# Patient Record
Sex: Male | Born: 1960 | Race: White | Hispanic: No | Marital: Married | Smoking: Former smoker
Health system: Southern US, Community
[De-identification: ages and names within clinical notes are randomized; demographics above are authoritative.]

## PROBLEM LIST (undated history)

## (undated) DIAGNOSIS — I10 Essential (primary) hypertension: Secondary | ICD-10-CM

## (undated) DIAGNOSIS — E785 Hyperlipidemia, unspecified: Secondary | ICD-10-CM

## (undated) DIAGNOSIS — K449 Diaphragmatic hernia without obstruction or gangrene: Secondary | ICD-10-CM

## (undated) DIAGNOSIS — I2510 Atherosclerotic heart disease of native coronary artery without angina pectoris: Secondary | ICD-10-CM

## (undated) HISTORY — DX: Essential (primary) hypertension: I10

## (undated) HISTORY — DX: Hyperlipidemia, unspecified: E78.5

## (undated) HISTORY — DX: Atherosclerotic heart disease of native coronary artery without angina pectoris: I25.10

---

## 2008-05-21 ENCOUNTER — Encounter (INDEPENDENT_AMBULATORY_CARE_PROVIDER_SITE_OTHER): Payer: Self-pay | Admitting: Family Medicine

## 2008-07-18 ENCOUNTER — Ambulatory Visit: Payer: Self-pay | Admitting: Family Medicine

## 2008-07-18 DIAGNOSIS — F411 Generalized anxiety disorder: Secondary | ICD-10-CM | POA: Insufficient documentation

## 2008-07-18 DIAGNOSIS — K219 Gastro-esophageal reflux disease without esophagitis: Secondary | ICD-10-CM

## 2008-07-18 DIAGNOSIS — M129 Arthropathy, unspecified: Secondary | ICD-10-CM | POA: Insufficient documentation

## 2008-07-18 DIAGNOSIS — K449 Diaphragmatic hernia without obstruction or gangrene: Secondary | ICD-10-CM | POA: Insufficient documentation

## 2008-07-18 DIAGNOSIS — F17200 Nicotine dependence, unspecified, uncomplicated: Secondary | ICD-10-CM | POA: Insufficient documentation

## 2008-07-18 DIAGNOSIS — I10 Essential (primary) hypertension: Secondary | ICD-10-CM | POA: Insufficient documentation

## 2008-07-18 DIAGNOSIS — M545 Low back pain: Secondary | ICD-10-CM

## 2008-07-18 LAB — CONVERTED CEMR LAB
Bilirubin Urine: NEGATIVE
Glucose, Urine, Semiquant: NEGATIVE
Protein, U semiquant: NEGATIVE
Urobilinogen, UA: 0.2
pH: 5.5

## 2008-07-20 ENCOUNTER — Encounter (INDEPENDENT_AMBULATORY_CARE_PROVIDER_SITE_OTHER): Payer: Self-pay | Admitting: Family Medicine

## 2008-07-20 LAB — CONVERTED CEMR LAB: Bacteria, UA: NONE SEEN

## 2008-07-29 ENCOUNTER — Encounter (INDEPENDENT_AMBULATORY_CARE_PROVIDER_SITE_OTHER): Payer: Self-pay | Admitting: Family Medicine

## 2008-08-16 ENCOUNTER — Ambulatory Visit: Payer: Self-pay | Admitting: Family Medicine

## 2008-08-23 ENCOUNTER — Telehealth (INDEPENDENT_AMBULATORY_CARE_PROVIDER_SITE_OTHER): Payer: Self-pay | Admitting: Family Medicine

## 2008-11-28 ENCOUNTER — Encounter (INDEPENDENT_AMBULATORY_CARE_PROVIDER_SITE_OTHER): Payer: Self-pay | Admitting: Family Medicine

## 2020-08-16 ENCOUNTER — Inpatient Hospital Stay (HOSPITAL_COMMUNITY): Payer: No Typology Code available for payment source

## 2020-08-16 ENCOUNTER — Emergency Department (HOSPITAL_COMMUNITY): Payer: No Typology Code available for payment source

## 2020-08-16 ENCOUNTER — Other Ambulatory Visit: Payer: Self-pay

## 2020-08-16 ENCOUNTER — Inpatient Hospital Stay (HOSPITAL_COMMUNITY)
Admission: EM | Admit: 2020-08-16 | Discharge: 2020-08-21 | DRG: 246 | Disposition: A | Discharge status: Home / Self Care | Payer: No Typology Code available for payment source | Attending: Internal Medicine | Admitting: Internal Medicine

## 2020-08-16 ENCOUNTER — Encounter (HOSPITAL_COMMUNITY)
Admission: EM | Disposition: A | Discharge status: Home / Self Care | Payer: Self-pay | Source: Home / Self Care | Attending: Internal Medicine

## 2020-08-16 ENCOUNTER — Encounter (HOSPITAL_COMMUNITY): Payer: Self-pay | Admitting: Emergency Medicine

## 2020-08-16 DIAGNOSIS — N179 Acute kidney failure, unspecified: Secondary | ICD-10-CM | POA: Diagnosis present

## 2020-08-16 DIAGNOSIS — R6883 Chills (without fever): Secondary | ICD-10-CM | POA: Diagnosis present

## 2020-08-16 DIAGNOSIS — Z20822 Contact with and (suspected) exposure to covid-19: Secondary | ICD-10-CM | POA: Diagnosis present

## 2020-08-16 DIAGNOSIS — K219 Gastro-esophageal reflux disease without esophagitis: Secondary | ICD-10-CM | POA: Diagnosis present

## 2020-08-16 DIAGNOSIS — Z720 Tobacco use: Secondary | ICD-10-CM

## 2020-08-16 DIAGNOSIS — I5043 Acute on chronic combined systolic (congestive) and diastolic (congestive) heart failure: Secondary | ICD-10-CM | POA: Diagnosis not present

## 2020-08-16 DIAGNOSIS — E781 Pure hyperglyceridemia: Secondary | ICD-10-CM | POA: Diagnosis present

## 2020-08-16 DIAGNOSIS — W57XXXA Bitten or stung by nonvenomous insect and other nonvenomous arthropods, initial encounter: Secondary | ICD-10-CM | POA: Diagnosis present

## 2020-08-16 DIAGNOSIS — I2119 ST elevation (STEMI) myocardial infarction involving other coronary artery of inferior wall: Secondary | ICD-10-CM

## 2020-08-16 DIAGNOSIS — I498 Other specified cardiac arrhythmias: Secondary | ICD-10-CM | POA: Diagnosis not present

## 2020-08-16 DIAGNOSIS — I150 Renovascular hypertension: Secondary | ICD-10-CM

## 2020-08-16 DIAGNOSIS — K449 Diaphragmatic hernia without obstruction or gangrene: Secondary | ICD-10-CM | POA: Diagnosis present

## 2020-08-16 DIAGNOSIS — I25110 Atherosclerotic heart disease of native coronary artery with unstable angina pectoris: Secondary | ICD-10-CM | POA: Diagnosis present

## 2020-08-16 DIAGNOSIS — N1831 Chronic kidney disease, stage 3a: Secondary | ICD-10-CM | POA: Diagnosis present

## 2020-08-16 DIAGNOSIS — F17213 Nicotine dependence, cigarettes, with withdrawal: Secondary | ICD-10-CM | POA: Diagnosis present

## 2020-08-16 DIAGNOSIS — Z955 Presence of coronary angioplasty implant and graft: Secondary | ICD-10-CM

## 2020-08-16 DIAGNOSIS — K30 Functional dyspepsia: Secondary | ICD-10-CM | POA: Diagnosis present

## 2020-08-16 DIAGNOSIS — R1013 Epigastric pain: Secondary | ICD-10-CM

## 2020-08-16 DIAGNOSIS — I213 ST elevation (STEMI) myocardial infarction of unspecified site: Secondary | ICD-10-CM | POA: Diagnosis present

## 2020-08-16 DIAGNOSIS — I130 Hypertensive heart and chronic kidney disease with heart failure and stage 1 through stage 4 chronic kidney disease, or unspecified chronic kidney disease: Secondary | ICD-10-CM | POA: Diagnosis present

## 2020-08-16 DIAGNOSIS — Z79899 Other long term (current) drug therapy: Secondary | ICD-10-CM

## 2020-08-16 DIAGNOSIS — I2111 ST elevation (STEMI) myocardial infarction involving right coronary artery: Secondary | ICD-10-CM | POA: Diagnosis not present

## 2020-08-16 DIAGNOSIS — E785 Hyperlipidemia, unspecified: Secondary | ICD-10-CM | POA: Diagnosis present

## 2020-08-16 DIAGNOSIS — I110 Hypertensive heart disease with heart failure: Secondary | ICD-10-CM | POA: Diagnosis present

## 2020-08-16 DIAGNOSIS — Y929 Unspecified place or not applicable: Secondary | ICD-10-CM | POA: Diagnosis not present

## 2020-08-16 DIAGNOSIS — F17200 Nicotine dependence, unspecified, uncomplicated: Secondary | ICD-10-CM | POA: Diagnosis present

## 2020-08-16 DIAGNOSIS — R0600 Dyspnea, unspecified: Secondary | ICD-10-CM

## 2020-08-16 DIAGNOSIS — I2510 Atherosclerotic heart disease of native coronary artery without angina pectoris: Secondary | ICD-10-CM

## 2020-08-16 DIAGNOSIS — N182 Chronic kidney disease, stage 2 (mild): Secondary | ICD-10-CM | POA: Diagnosis present

## 2020-08-16 DIAGNOSIS — I200 Unstable angina: Secondary | ICD-10-CM | POA: Diagnosis present

## 2020-08-16 DIAGNOSIS — E7800 Pure hypercholesterolemia, unspecified: Secondary | ICD-10-CM | POA: Diagnosis not present

## 2020-08-16 DIAGNOSIS — N189 Chronic kidney disease, unspecified: Secondary | ICD-10-CM | POA: Diagnosis not present

## 2020-08-16 DIAGNOSIS — I10 Essential (primary) hypertension: Secondary | ICD-10-CM | POA: Diagnosis present

## 2020-08-16 DIAGNOSIS — I771 Stricture of artery: Secondary | ICD-10-CM | POA: Diagnosis present

## 2020-08-16 HISTORY — PX: CORONARY/GRAFT ACUTE MI REVASCULARIZATION: CATH118305

## 2020-08-16 HISTORY — PX: LEFT HEART CATH AND CORONARY ANGIOGRAPHY: CATH118249

## 2020-08-16 HISTORY — DX: Diaphragmatic hernia without obstruction or gangrene: K44.9

## 2020-08-16 LAB — POCT ACTIVATED CLOTTING TIME: Activated Clotting Time: 243 seconds

## 2020-08-16 LAB — CBC WITH DIFFERENTIAL/PLATELET
Abs Immature Granulocytes: 0.06 10*3/uL (ref 0.00–0.07)
Basophils Absolute: 0.1 10*3/uL (ref 0.0–0.1)
Basophils Relative: 0 %
Eosinophils Absolute: 0.1 10*3/uL (ref 0.0–0.5)
Eosinophils Relative: 1 %
HCT: 50.8 % (ref 39.0–52.0)
Hemoglobin: 16.6 g/dL (ref 13.0–17.0)
Immature Granulocytes: 0 %
Lymphocytes Relative: 14 %
Lymphs Abs: 2.2 10*3/uL (ref 0.7–4.0)
MCH: 28.9 pg (ref 26.0–34.0)
MCHC: 32.7 g/dL (ref 30.0–36.0)
MCV: 88.5 fL (ref 80.0–100.0)
Monocytes Absolute: 0.9 10*3/uL (ref 0.1–1.0)
Monocytes Relative: 6 %
Neutro Abs: 12.1 10*3/uL — ABNORMAL HIGH (ref 1.7–7.7)
Neutrophils Relative %: 79 %
Platelets: 283 10*3/uL (ref 150–400)
RBC: 5.74 MIL/uL (ref 4.22–5.81)
RDW: 14.5 % (ref 11.5–15.5)
WBC: 15.3 10*3/uL — ABNORMAL HIGH (ref 4.0–10.5)
nRBC: 0 % (ref 0.0–0.2)

## 2020-08-16 LAB — BASIC METABOLIC PANEL
Anion gap: 10 (ref 5–15)
BUN: 16 mg/dL (ref 6–20)
CO2: 24 mmol/L (ref 22–32)
Calcium: 8.4 mg/dL — ABNORMAL LOW (ref 8.9–10.3)
Chloride: 103 mmol/L (ref 98–111)
Creatinine, Ser: 1.17 mg/dL (ref 0.61–1.24)
GFR, Estimated: >60 mL/min (ref >60)
Glucose, Bld: 101 mg/dL — ABNORMAL HIGH (ref 70–99)
Potassium: 3.6 mmol/L (ref 3.5–5.1)
Sodium: 137 mmol/L (ref 135–145)

## 2020-08-16 LAB — COMPREHENSIVE METABOLIC PANEL
ALT: 25 U/L (ref 0–44)
AST: 54 U/L — ABNORMAL HIGH (ref 15–41)
Albumin: 4.1 g/dL (ref 3.5–5.0)
Alkaline Phosphatase: 98 U/L (ref 38–126)
Anion gap: 10 (ref 5–15)
BUN: 19 mg/dL (ref 6–20)
CO2: 28 mmol/L (ref 22–32)
Calcium: 8.7 mg/dL — ABNORMAL LOW (ref 8.9–10.3)
Chloride: 99 mmol/L (ref 98–111)
Creatinine, Ser: 1.3 mg/dL — ABNORMAL HIGH (ref 0.61–1.24)
GFR, Estimated: >60 mL/min (ref >60)
Glucose, Bld: 121 mg/dL — ABNORMAL HIGH (ref 70–99)
Potassium: 3.3 mmol/L — ABNORMAL LOW (ref 3.5–5.1)
Sodium: 137 mmol/L (ref 135–145)
Total Bilirubin: 1.2 mg/dL (ref 0.3–1.2)
Total Protein: 7.9 g/dL (ref 6.5–8.1)

## 2020-08-16 LAB — ECHOCARDIOGRAM COMPLETE
Area-P 1/2: 3.42 cm2
S' Lateral: 3.75 cm
Weight: 3600 oz

## 2020-08-16 LAB — PROTIME-INR
INR: 1 (ref 0.8–1.2)
Prothrombin Time: 13.1 seconds (ref 11.4–15.2)

## 2020-08-16 LAB — RESP PANEL BY RT-PCR (FLU A&B, COVID) ARPGX2
Influenza A by PCR: NEGATIVE
Influenza B by PCR: NEGATIVE
SARS Coronavirus 2 by RT PCR: NEGATIVE

## 2020-08-16 LAB — LIPID PANEL
Cholesterol: 223 mg/dL — ABNORMAL HIGH (ref 0–200)
HDL: 34 mg/dL — ABNORMAL LOW (ref >40)
LDL Cholesterol: 153 mg/dL — ABNORMAL HIGH (ref 0–99)
Total CHOL/HDL Ratio: 6.6 RATIO
Triglycerides: 180 mg/dL — ABNORMAL HIGH (ref <150)
VLDL: 36 mg/dL (ref 0–40)

## 2020-08-16 LAB — APTT: aPTT: 30 seconds (ref 24–36)

## 2020-08-16 LAB — TROPONIN I (HIGH SENSITIVITY)
Troponin I (High Sensitivity): 2351 ng/L (ref <18)
Troponin I (High Sensitivity): >24000 ng/L (ref <18)

## 2020-08-16 LAB — MAGNESIUM: Magnesium: 2.1 mg/dL (ref 1.7–2.4)

## 2020-08-16 LAB — GLUCOSE, CAPILLARY: Glucose-Capillary: 107 mg/dL — ABNORMAL HIGH (ref 70–99)

## 2020-08-16 LAB — MRSA PCR SCREENING: MRSA by PCR: NEGATIVE

## 2020-08-16 SURGERY — CORONARY/GRAFT ACUTE MI REVASCULARIZATION
Anesthesia: LOCAL

## 2020-08-16 MED ORDER — METOPROLOL TARTRATE 12.5 MG HALF TABLET
12.5000 mg | ORAL_TABLET | Freq: Two times a day (BID) | ORAL | Status: DC
  Administered 2020-08-16 – 2020-08-17 (×3): 12.5 mg via ORAL
  Filled 2020-08-16 (×3): qty 1

## 2020-08-16 MED ORDER — ATORVASTATIN CALCIUM 80 MG PO TABS
80.0000 mg | ORAL_TABLET | Freq: Every day | ORAL | Status: DC
  Administered 2020-08-16 – 2020-08-21 (×6): 80 mg via ORAL
  Filled 2020-08-16 (×6): qty 1

## 2020-08-16 MED ORDER — FENTANYL CITRATE (PF) 100 MCG/2ML IJ SOLN
INTRAMUSCULAR | Status: DC | PRN
  Administered 2020-08-16 (×2): 25 ug via INTRAVENOUS

## 2020-08-16 MED ORDER — LIDOCAINE HCL (PF) 1 % IJ SOLN
INTRAMUSCULAR | Status: DC | PRN
  Administered 2020-08-16: 2 mL

## 2020-08-16 MED ORDER — SODIUM CHLORIDE 0.9 % IV SOLN
INTRAVENOUS | Status: DC
  Administered 2020-08-16: 1000 mL via INTRAVENOUS

## 2020-08-16 MED ORDER — CHLORHEXIDINE GLUCONATE CLOTH 2 % EX PADS
6.0000 | MEDICATED_PAD | Freq: Every day | CUTANEOUS | Status: DC
  Administered 2020-08-16 – 2020-08-19 (×4): 6 via TOPICAL

## 2020-08-16 MED ORDER — TICAGRELOR 90 MG PO TABS
ORAL_TABLET | ORAL | Status: AC
  Filled 2020-08-16: qty 2

## 2020-08-16 MED ORDER — NITROGLYCERIN IN D5W 200-5 MCG/ML-% IV SOLN
0.0000 ug/min | INTRAVENOUS | Status: DC
  Administered 2020-08-17: 45 ug/min via INTRAVENOUS
  Administered 2020-08-18: 100 ug/min via INTRAVENOUS
  Filled 2020-08-16 (×3): qty 250

## 2020-08-16 MED ORDER — HEPARIN BOLUS VIA INFUSION: 5000.0000 [IU] | Freq: Once | INTRAVENOUS | Status: AC

## 2020-08-16 MED ORDER — POTASSIUM CHLORIDE CRYS ER 20 MEQ PO TBCR
40.0000 meq | EXTENDED_RELEASE_TABLET | ORAL | Status: AC
  Administered 2020-08-16 (×2): 40 meq via ORAL
  Filled 2020-08-16 (×2): qty 2

## 2020-08-16 MED ORDER — MORPHINE SULFATE (PF) 2 MG/ML IV SOLN
2.0000 mg | INTRAVENOUS | Status: DC | PRN
Start: 2020-08-16 — End: 2020-08-16
  Administered 2020-08-16 (×3): 2 mg via INTRAVENOUS
  Filled 2020-08-16 (×3): qty 1

## 2020-08-16 MED ORDER — TIROFIBAN HCL IN NACL 5-0.9 MG/100ML-% IV SOLN
0.1500 ug/kg/min | INTRAVENOUS | Status: AC
  Administered 2020-08-16: 0.15 ug/kg/min via INTRAVENOUS
  Filled 2020-08-16: qty 100

## 2020-08-16 MED ORDER — ACETAMINOPHEN 325 MG PO TABS
650.0000 mg | ORAL_TABLET | ORAL | Status: DC | PRN
  Administered 2020-08-16 – 2020-08-20 (×4): 650 mg via ORAL
  Filled 2020-08-16 (×4): qty 2

## 2020-08-16 MED ORDER — ASPIRIN 81 MG PO CHEW
81.0000 mg | CHEWABLE_TABLET | Freq: Every day | ORAL | Status: DC
  Administered 2020-08-17 – 2020-08-21 (×5): 81 mg via ORAL
  Filled 2020-08-16 (×6): qty 1

## 2020-08-16 MED ORDER — LIDOCAINE HCL (PF) 1 % IJ SOLN
INTRAMUSCULAR | Status: AC
  Filled 2020-08-16: qty 30

## 2020-08-16 MED ORDER — ONDANSETRON HCL 4 MG/2ML IJ SOLN
4.0000 mg | Freq: Four times a day (QID) | INTRAMUSCULAR | Status: DC | PRN
Start: 2020-08-16 — End: 2020-08-21
  Administered 2020-08-16: 4 mg via INTRAVENOUS
  Filled 2020-08-16: qty 2

## 2020-08-16 MED ORDER — LABETALOL HCL 5 MG/ML IV SOLN: 10.0000 mg | INTRAVENOUS | Status: AC | PRN

## 2020-08-16 MED ORDER — HEPARIN (PORCINE) 25000 UT/250ML-% IV SOLN
INTRAVENOUS | Status: AC
  Administered 2020-08-16: 5000 [IU] via INTRAVENOUS
  Filled 2020-08-16: qty 250

## 2020-08-16 MED ORDER — HEPARIN SODIUM (PORCINE) 1000 UNIT/ML IJ SOLN
INTRAMUSCULAR | Status: AC
  Filled 2020-08-16: qty 1

## 2020-08-16 MED ORDER — NITROGLYCERIN 0.4 MG SL SUBL
0.4000 mg | SUBLINGUAL_TABLET | SUBLINGUAL | Status: DC | PRN
  Administered 2020-08-16 (×3): 0.4 mg via SUBLINGUAL
  Filled 2020-08-16: qty 1

## 2020-08-16 MED ORDER — ALPRAZOLAM 0.5 MG PO TABS
0.5000 mg | ORAL_TABLET | Freq: Three times a day (TID) | ORAL | Status: AC | PRN
  Administered 2020-08-16 (×2): 0.5 mg via ORAL
  Filled 2020-08-16 (×2): qty 1

## 2020-08-16 MED ORDER — NITROGLYCERIN IN D5W 200-5 MCG/ML-% IV SOLN
INTRAVENOUS | Status: AC
  Filled 2020-08-16: qty 250

## 2020-08-16 MED ORDER — ENOXAPARIN SODIUM 40 MG/0.4ML IJ SOSY
40.0000 mg | PREFILLED_SYRINGE | INTRAMUSCULAR | Status: DC
  Administered 2020-08-17: 40 mg via SUBCUTANEOUS
  Filled 2020-08-16: qty 0.4

## 2020-08-16 MED ORDER — HEPARIN SODIUM (PORCINE) 1000 UNIT/ML IJ SOLN
INTRAMUSCULAR | Status: DC | PRN
  Administered 2020-08-16: 5000 [IU] via INTRAVENOUS

## 2020-08-16 MED ORDER — DOXYCYCLINE HYCLATE 100 MG PO TABS
100.0000 mg | ORAL_TABLET | Freq: Two times a day (BID) | ORAL | Status: DC
  Administered 2020-08-16 – 2020-08-21 (×11): 100 mg via ORAL
  Filled 2020-08-16 (×11): qty 1

## 2020-08-16 MED ORDER — FUROSEMIDE 10 MG/ML IJ SOLN
INTRAMUSCULAR | Status: AC
  Filled 2020-08-16: qty 4

## 2020-08-16 MED ORDER — ASPIRIN 81 MG PO CHEW
324.0000 mg | CHEWABLE_TABLET | Freq: Once | ORAL | Status: AC
  Administered 2020-08-16: 324 mg via ORAL
  Filled 2020-08-16: qty 4

## 2020-08-16 MED ORDER — MIDAZOLAM HCL 2 MG/2ML IJ SOLN
INTRAMUSCULAR | Status: DC | PRN
  Administered 2020-08-16 (×2): 1 mg via INTRAVENOUS

## 2020-08-16 MED ORDER — NITROGLYCERIN 1 MG/10 ML FOR IR/CATH LAB
INTRA_ARTERIAL | Status: DC | PRN
  Administered 2020-08-16: 200 ug via INTRACORONARY

## 2020-08-16 MED ORDER — TIROFIBAN HCL IN NACL 5-0.9 MG/100ML-% IV SOLN
INTRAVENOUS | Status: AC | PRN
  Administered 2020-08-16: 0.15 ug/kg/min via INTRAVENOUS

## 2020-08-16 MED ORDER — TIROFIBAN HCL IN NACL 5-0.9 MG/100ML-% IV SOLN
INTRAVENOUS | Status: AC
  Filled 2020-08-16: qty 100

## 2020-08-16 MED ORDER — VERAPAMIL HCL 2.5 MG/ML IV SOLN
INTRAVENOUS | Status: AC
  Filled 2020-08-16: qty 2

## 2020-08-16 MED ORDER — FUROSEMIDE 10 MG/ML IJ SOLN
INTRAMUSCULAR | Status: DC | PRN
  Administered 2020-08-16: 40 mg via INTRAVENOUS

## 2020-08-16 MED ORDER — HYDRALAZINE HCL 20 MG/ML IJ SOLN
10.0000 mg | INTRAMUSCULAR | Status: AC | PRN
  Administered 2020-08-16: 10 mg via INTRAVENOUS
  Filled 2020-08-16: qty 1

## 2020-08-16 MED ORDER — HEPARIN (PORCINE) IN NACL 1000-0.9 UT/500ML-% IV SOLN
INTRAVENOUS | Status: AC
  Filled 2020-08-16: qty 1000

## 2020-08-16 MED ORDER — TIROFIBAN (AGGRASTAT) BOLUS VIA INFUSION
INTRAVENOUS | Status: DC | PRN
  Administered 2020-08-16: 2552.5 ug via INTRAVENOUS

## 2020-08-16 MED ORDER — FENTANYL CITRATE (PF) 100 MCG/2ML IJ SOLN
INTRAMUSCULAR | Status: AC
  Filled 2020-08-16: qty 2

## 2020-08-16 MED ORDER — MORPHINE SULFATE (PF) 2 MG/ML IV SOLN
2.0000 mg | INTRAVENOUS | Status: DC | PRN
  Administered 2020-08-16: 2 mg via INTRAVENOUS
  Administered 2020-08-16: 4 mg via INTRAVENOUS
  Administered 2020-08-17: 2 mg via INTRAVENOUS
  Administered 2020-08-17 (×7): 4 mg via INTRAVENOUS
  Administered 2020-08-18 (×2): 2 mg via INTRAVENOUS
  Filled 2020-08-16: qty 1
  Filled 2020-08-16 (×3): qty 2
  Filled 2020-08-16: qty 1
  Filled 2020-08-16: qty 2
  Filled 2020-08-16: qty 1
  Filled 2020-08-16 (×2): qty 2
  Filled 2020-08-16: qty 1
  Filled 2020-08-16 (×2): qty 2

## 2020-08-16 MED ORDER — HEPARIN SODIUM (PORCINE) 5000 UNIT/ML IJ SOLN: 60.0000 [IU]/kg | Freq: Once | INTRAMUSCULAR | Status: DC

## 2020-08-16 MED ORDER — NITROGLYCERIN IN D5W 200-5 MCG/ML-% IV SOLN
INTRAVENOUS | Status: AC | PRN
  Administered 2020-08-16: 10 ug/min via INTRAVENOUS

## 2020-08-16 MED ORDER — SODIUM CHLORIDE 0.9% FLUSH
3.0000 mL | Freq: Two times a day (BID) | INTRAVENOUS | Status: DC
  Administered 2020-08-16 – 2020-08-20 (×8): 3 mL via INTRAVENOUS

## 2020-08-16 MED ORDER — NITROGLYCERIN 1 MG/10 ML FOR IR/CATH LAB
INTRA_ARTERIAL | Status: AC
  Filled 2020-08-16: qty 10

## 2020-08-16 MED ORDER — HEPARIN (PORCINE) IN NACL 1000-0.9 UT/500ML-% IV SOLN
INTRAVENOUS | Status: DC | PRN
  Administered 2020-08-16 (×2): 500 mL

## 2020-08-16 MED ORDER — SODIUM CHLORIDE 0.9% FLUSH: 3.0000 mL | INTRAVENOUS | Status: DC | PRN

## 2020-08-16 MED ORDER — IOHEXOL 350 MG/ML SOLN
INTRAVENOUS | Status: DC | PRN
  Administered 2020-08-16: 100 mL

## 2020-08-16 MED ORDER — TICAGRELOR 90 MG PO TABS
90.0000 mg | ORAL_TABLET | Freq: Two times a day (BID) | ORAL | Status: DC
  Administered 2020-08-16 – 2020-08-20 (×8): 90 mg via ORAL
  Filled 2020-08-16 (×9): qty 1

## 2020-08-16 MED ORDER — VERAPAMIL HCL 2.5 MG/ML IV SOLN
INTRAVENOUS | Status: DC | PRN
  Administered 2020-08-16: 10 mL via INTRA_ARTERIAL

## 2020-08-16 MED ORDER — MIDAZOLAM HCL 2 MG/2ML IJ SOLN
INTRAMUSCULAR | Status: AC
  Filled 2020-08-16: qty 2

## 2020-08-16 MED ORDER — TICAGRELOR 90 MG PO TABS
ORAL_TABLET | ORAL | Status: DC | PRN
  Administered 2020-08-16: 180 mg via ORAL

## 2020-08-16 MED ORDER — SODIUM CHLORIDE 0.9 % IV SOLN: 250.0000 mL | INTRAVENOUS | Status: DC | PRN

## 2020-08-16 SURGICAL SUPPLY — 18 items
BALLN SAPPHIRE 2.5X12 (BALLOONS) ×2
BALLN SAPPHIRE ~~LOC~~ 4.0X12 (BALLOONS) ×2 IMPLANT
BALLOON SAPPHIRE 2.5X12 (BALLOONS) ×1 IMPLANT
CATH INFINITI 5 FR JL3.5 (CATHETERS) ×2 IMPLANT
CATH LAUNCHER 5F JR4 (CATHETERS) ×2 IMPLANT
CATH LAUNCHER 6FR JR4 (CATHETERS) ×2 IMPLANT
DEVICE RAD COMP TR BAND LRG (VASCULAR PRODUCTS) ×2 IMPLANT
ELECT DEFIB PAD ADLT CADENCE (PAD) ×2 IMPLANT
GLIDESHEATH SLEND SS 6F .021 (SHEATH) ×2 IMPLANT
GUIDEWIRE INQWIRE 1.5J.035X260 (WIRE) ×1 IMPLANT
INQWIRE 1.5J .035X260CM (WIRE) ×2
KIT ENCORE 26 ADVANTAGE (KITS) ×2 IMPLANT
KIT HEART LEFT (KITS) ×2 IMPLANT
PACK CARDIAC CATHETERIZATION (CUSTOM PROCEDURE TRAY) ×2 IMPLANT
STENT RESOLUTE ONYX 3.5X18 (Permanent Stent) ×2 IMPLANT
TRANSDUCER W/STOPCOCK (MISCELLANEOUS) ×2 IMPLANT
TUBING CIL FLEX 10 FLL-RA (TUBING) ×2 IMPLANT
WIRE RUNTHROUGH .014X180CM (WIRE) ×2 IMPLANT

## 2020-08-16 NOTE — ED Provider Notes (Signed)
I was shown the patient's ECG around 0714. It appeared to be a STEMI. I talked to the patient who was having chest pain. Discussed with nursing, STEMI activated around 0715. Order set initiated. Full note and care to be taken by Dr. Rhunette Croft.    Aujanae Mccullum, Barbara Cower, MD 08/16/20 [phone]

## 2020-08-16 NOTE — ED Notes (Signed)
Date and time results received: 08/16/20 08090  Test: Trop Critical Value: 2351  Name of Provider Notified: Rhunette Croft, Ankit,MD  Orders Received? Or Actions Taken?: See MAR, pt transported for cath

## 2020-08-16 NOTE — ED Notes (Signed)
Pt placed on 2L Page

## 2020-08-16 NOTE — Progress Notes (Signed)
[facility]  Date: 08/16/20  Time: 3:50 PM  Patient reevaluated due to intermittent chest discomfort.  He states that it comes and goes, lasting only a few seconds.  He feels very uneasy about it.  It seems to correspond to rhythm changes on the monitor.  Tele shows intermittent sinus rhythm and AIVR as well as PVC's and brief NSVT.  He received alprazolam 0.5 mg x 1 earlier with minimal relief.  He was also started on metoprolol and just received morphine 2 mg IV x 1.  He feels like the morphine has helped.  We will continue to monitor.  Hopefully, AIVR will improve over the next few hours.  Potassium has been repleated with repeat BMP and HS-TnI pending.  Will repeat EKG for further chest pain.  Continue to titrate IV NTG.  Yvonne Kendall, MD [facility]

## 2020-08-16 NOTE — ED Provider Notes (Addendum)
[facility] EMERGENCY DEPARTMENT Provider Note   CSN: [id number] Arrival date & time: 08/16/20  0703     History Chief Complaint  Patient presents with  . Code STEMI    John Wilkerson is a 60 y.o. male.  HPI    60 year old male with history of hypertension, GERD, hiatal hernia comes in with chief complaint of heartburn.  For the past 3 days patient has been waking up with severe heartburn type feeling.  Usually the symptoms resolved within a couple of hours.  Today the pain is not letting.  He has associated diaphoresis and feeling clammy.  Patient denies any shortness of breath, nausea.  He denies any exertional chest pain.  Patient smokes about a pack a day. No history of recent cardiac work-up.  He reports that he has history of hernia and GERD, does not take any medications for it.  He has been thinking that this pain is GERD all along, until today when the pain was unrelenting and not responding to antacid.  History reviewed. No pertinent past medical history.  Patient Active Problem List   Diagnosis Date Noted  . ANXIETY 07/18/2008  . TOBACCO USER 07/18/2008  . HYPERTENSION 07/18/2008  . GERD 07/18/2008  . HIATAL HERNIA 07/18/2008  . ARTHRITIS 07/18/2008  . LOW BACK PAIN, CHRONIC 07/18/2008     No family history on file.     Home Medications Prior to Admission medications   Not on File    Allergies    Codeine  Review of Systems   Review of Systems  Constitutional: Positive for activity change.  Respiratory: Negative for shortness of breath.   Cardiovascular: Negative for chest pain.  Gastrointestinal: Positive for abdominal pain.  Hematological: Does not bruise/bleed easily.  All other systems reviewed and are negative.   Physical Exam Updated Vital Signs BP (!) 180/91   Pulse 64   Temp 98.3 F (36.8 C)   Resp 15   Wt 102.1 kg   SpO2 94%   Physical Exam Vitals and nursing note reviewed.  Constitutional:      Appearance: He is  well-developed.  HENT:     Head: Atraumatic.  Cardiovascular:     Rate and Rhythm: Normal rate.  Pulmonary:     Effort: Pulmonary effort is normal.  Musculoskeletal:     Cervical back: Neck supple.  Skin:    General: Skin is warm.  Neurological:     Mental Status: He is alert and oriented to person, place, and time.     ED Results / Procedures / Treatments   Labs (all labs ordered are listed, but only abnormal results are displayed) Labs Reviewed  CBC WITH DIFFERENTIAL/PLATELET - Abnormal; Notable for the following components:      Result Value   WBC 15.3 (*)    Neutro Abs 12.1 (*)    All other components within normal limits  RESP PANEL BY RT-PCR (FLU A&B, COVID) ARPGX2  PROTIME-INR  APTT  HEMOGLOBIN A1C  COMPREHENSIVE METABOLIC PANEL  LIPID PANEL  TROPONIN I (HIGH SENSITIVITY)    EKG EKG Interpretation  Date/Time:  Saturday August 16 2020 07:13:23 EDT Ventricular Rate:  70 PR Interval:  144 QRS Duration: 125 QT Interval:  477 QTC Calculation: 515 R Axis:   222 Text Interpretation: Sinus rhythm Probable left atrial enlargement Nonspecific intraventricular conduction delay Inferior infarct, acute (RCA) Anteroseptal infarct, old Probable RV involvement, suggest recording right precordial leads repeat - unchanged Confirmed by Derwood Kaplan [phone]) on 08/16/2020 7:23:17 AM  EKG Interpretation  Date/Time:  Saturday August 16 2020 07:20:21 EDT Ventricular Rate:  73 PR Interval:  145 QRS Duration: 125 QT Interval:  457 QTC Calculation: 504 R Axis:   220 Text Interpretation: Sinus rhythm Probable left atrial enlargement Nonspecific intraventricular conduction delay Inferior infarct, acute (RCA) Probable RV involvement, suggest recording right precordial leads No significant change since last tracing Confirmed by Derwood Kaplan [phone]) on 08/16/2020 7:58:03 AM        Radiology No results found.  Procedures .Critical Care Performed by: Derwood Kaplan,  MD Authorized by: Derwood Kaplan, MD   Critical care provider statement:    Critical care time (minutes):  40   Critical care was necessary to treat or prevent imminent or life-threatening deterioration of the following conditions:  Cardiac failure   Critical care was time spent personally by me on the following activities:  Discussions with consultants, evaluation of patient's response to treatment, examination of patient, ordering and performing treatments and interventions, ordering and review of laboratory studies, ordering and review of radiographic studies, pulse oximetry, re-evaluation of patient's condition, obtaining history from patient or surrogate and review of old charts     Medications Ordered in ED Medications  0.9 %  sodium chloride infusion (1,000 mLs Intravenous New Bag/Given 08/16/20 0725)  nitroGLYCERIN (NITROSTAT) SL tablet 0.4 mg (0.4 mg Sublingual Given 08/16/20 0736)  aspirin chewable tablet 324 mg (324 mg Oral Given 08/16/20 0724)  heparin bolus via infusion 5,000 Units (5,000 Units Intravenous Bolus from Bag 08/16/20 0729)    ED Course  I have reviewed the triage vital signs and the nursing notes.  Pertinent labs & imaging results that were available during my care of the patient were reviewed by me and considered in my medical decision making (see chart for details).    MDM Rules/Calculators/A&P                          DDx includes: Pancreatitis Hepatobiliary pathology including cholecystitis Gastritis/PUD SBO ACS syndrome Aortic Dissection  60 year old male comes in with chief complaint of epigastric abdominal and chest discomfort.  Symptoms have been present for the last 3 days, in the morning.  Typically responds to antacids, today was not letting go.  He has history of GERD and hiatal hernia, not taking any medications.  Smokes a pack a day.  Also has hypertension.  No EKG in our system.  Differential diagnosis included MI, EKG does have some ST changes  in the inferior leads, ST depression in aVL, lead I.  Repeat EKG is unchanged.  Discussed case with STEMI doc on call.  Plan is to activate code STEMI, if the coronary work-up is negative then patient will need work-up for inguinal hernia, peptic ulcer disease.  Aspirin, heparin, nitro given.  Final Clinical Impression(s) / ED Diagnoses Final diagnoses:  Inferior MI (HCC)  Epigastric pain    Rx / DC Orders ED Discharge Orders    None       Derwood Kaplan, MD 08/16/20 0263    Derwood Kaplan, MD 08/16/20 [phone]

## 2020-08-16 NOTE — ED Notes (Signed)
Report called to cardiac cath lab. Pt in route

## 2020-08-16 NOTE — H&P (Signed)
Cardiology Admission History and Physical:   Patient ID: John Wilkerson MRN: [id number]; DOB: [birth year]   Admission date: 08/16/2020  PCP:  Pcp, No   [facility] Providers Cardiologist:  None      Chief Complaint:  Chest pain  Patient Profile:   John Wilkerson is a 60 y.o. male with tobacco use and hiatal hernia who is being seen 08/16/2020 for the evaluation of chest pain and abnormal EKG.  History of Present Illness:   John Wilkerson reports developing intermittent chest and epigastric pain that he describes as indigestion 3 days ago.  He had noticed several ticks on him the day before and wonders if that could have been related.  The symptoms would wax and wane and were without associated symptoms.  This morning, he awoke around 5 AM with more significant chest discomfort that he still describes as indigestion associated with weakness in both arms.  He has had chills but no fevers.  He denies shortness of breath, palpitations, and lightheadedness.  He presented to the [facility] emergency department for further evaluation and was noted to have inferior ST segment elevation with Q waves.  He was given sublingual nitroglycerin with improvement in his chest discomfort, which is still 1-2/10 in intensity.  He has been transferred to [facility] for emergent cardiac catheterization.   Past Medical History:  Diagnosis Date  . Hiatal hernia     History reviewed. No pertinent surgical history.   Medications Prior to Admission: None   Allergies:    Allergies  Allergen Reactions  . Codeine     REACTION: Tunnel vision and blackouts    Social History:   Social History   Tobacco Use  . Smoking status: Current Every Day Smoker    Packs/day: 1.00    Types: Cigarettes  . Smokeless tobacco: Never Used  Substance Use Topics  . Alcohol use: Not Currently  . Drug use: Not Currently     Family History:   The patient's family history includes AAA (abdominal aortic aneurysm) in his  father.    ROS:  Review of Systems  Unable to perform ROS: Acuity of condition   Physical Exam/Data:   Vitals:   08/16/20 0722 08/16/20 0727 08/16/20 0730 08/16/20 0739  BP: (!) 202/108  (!) 180/91   Pulse: 72  64   Resp: 19  15   Temp:  98.3 F (36.8 C)    SpO2: 96%  94%   Weight:    102.1 kg   No intake or output data in the 24 hours ending 08/16/20 0827 Last 3 Weights 08/16/2020 08/16/2008 07/18/2008  Weight (lbs) 225 lb 215 lb 214 lb  Weight (kg) 102.059 kg 97.523 kg 97.07 kg     There is no height or weight on file to calculate BMI.  General:  Well nourished, well developed, in no acute distress HEENT: normal Lymph: no adenopathy Neck: no JVD Endocrine:  No thryomegaly Vascular: No carotid bruits; 2+ radial pulses bilaterally Cardiac:  normal S1, S2; RRR; no murmur  Lungs: Mildly diminished breath sounds anteriorly.  No wheezes or crackles. Abd: soft, nontender, no hepatomegaly  Ext: no lower extremity edema Musculoskeletal:  No deformities, BUE and BLE strength normal and equal Skin: warm and dry  Neuro:  CNs 2-12 intact, no focal abnormalities noted Psych:  Normal affect    EKG:  The ECG that was done today at 7:13 AM was personally reviewed and demonstrates normal sinus rhythm with inferior ST elevation and Q  waves.  Relevant CV Studies: None  Laboratory Data:  High Sensitivity Troponin:   Recent Labs  Lab 08/16/20 0718  TROPONINIHS 2,351*      Chemistry Recent Labs  Lab 08/16/20 0718  NA 137  K 3.3*  CL 99  CO2 28  GLUCOSE 121*  BUN 19  CREATININE 1.30*  CALCIUM 8.7*  GFRNONAA >60  ANIONGAP 10    Recent Labs  Lab 08/16/20 0718  PROT 7.9  ALBUMIN 4.1  AST 54*  ALT 25  ALKPHOS 98  BILITOT 1.2   Hematology Recent Labs  Lab 08/16/20 0718  WBC 15.3*  RBC 5.74  HGB 16.6  HCT 50.8  MCV 88.5  MCH 28.9  MCHC 32.7  RDW 14.5  PLT 283   BNPNo results for input(s): BNP, PROBNP in the last 168 hours.  DDimer No results for input(s):  DDIMER in the last 168 hours.   Radiology/Studies:  DG Chest Port 1 View  Result Date: 08/16/2020 CLINICAL DATA:  Chest pain EXAM: PORTABLE CHEST 1 VIEW COMPARISON:  None. FINDINGS: A portion of the lung bases not visualized. There is interstitial pulmonary edema with potential underlying fibrotic type change. No air space consolidation in visualized lungs. There is questionable bullous disease in the upper lobes. Heart is mildly enlarged with pulmonary vascularity within normal limits. No evident adenopathy. No bone lesions. No evident pneumothorax. IMPRESSION: Portions of lung bases not visualized. Cardiomegaly with a degree of interstitial pulmonary edema may reflect a degree of underlying congestive heart failure. 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 Electronically Signed   By: Bretta Bang III M.D.   On: 08/16/2020 08:17     Assessment and Plan:   Inferior STEMI: Patient with intermittent "indigestion" over the last 3 days presenting to [facility] with inferior STEMI.  He has been transferred to [facility] for emergent cardiac catheterization.  He still has symptoms at this time.  He has received aspirin, heparin, and sublingual nitroglycerin with incomplete resolution of his pain.  We have discussed the procedure and have agreed to proceed with emergent left heart catheterization and possible PCI.  I have reviewed the risks, indications, and alternatives to cardiac catheterization, possible angioplasty, and stenting with the patient. Risks include but are not limited to bleeding, infection, vascular injury, stroke, myocardial infection, arrhythmia, kidney injury, radiation-related injury in the case of prolonged fluoroscopy use, emergency cardiac surgery, and death. The patient understands the risks of serious complication is 1-2 in 1000 with diagnostic cardiac cath and 1-2% or less with angioplasty/stenting.  Chills and tick  bites: Patient notes tick bites day before his symptoms began.  He has also been having some chills.  He has not noticed a rash.  Further evaluation be performed following catheterization with low threshold for empiric treatment with doxycycline given risk for RMSF.  Tobacco abuse: Smoking cessation encouraged.   Risk Assessment/Risk Scores:    TIMI Risk Score for ST  Elevation MI:   The patient's TIMI risk score is 1, which indicates a 1.6% risk of all cause mortality at 30 days.   Severity of Illness: The appropriate patient status for this patient is INPATIENT. Inpatient status is judged to be reasonable and necessary in order to provide the required intensity of service to ensure the patient's safety. The patient's presenting symptoms, physical exam findings, and initial radiographic and laboratory data in the context of their chronic comorbidities is felt to place them at high risk for  further clinical deterioration. Furthermore, it is not anticipated that the patient will be medically stable for discharge from the hospital within 2 midnights of admission. The following factors support the patient status of inpatient.   " The patient's presenting symptoms include chest pain/indigestion. " The initial radiographic and laboratory data are worrisome because of EKG demonstrating inferior MI with persistent elevation. " The chronic co-morbidities include tobacco abuse.   * I certify that at the point of admission it is my clinical judgment that the patient will require inpatient hospital care spanning beyond 2 midnights from the point of admission due to high intensity of service, high risk for further deterioration and high frequency of surveillance required.*   For questions or updates, please contact [facility] Please consult [email or web address] for contact info under   Signed, Yvonne Kendall, MD  08/16/2020 8:27 AM

## 2020-08-16 NOTE — Progress Notes (Signed)
Date and time results received: 08/16/20 1900 (use smartphrase ".now" to insert current time)  Test: Troponins Critical Value: >24000  Name of Provider Notified: Fatima Blank MD  Orders Received? Or Actions Taken?: Expected value  Judeth Horn, RN

## 2020-08-16 NOTE — ED Triage Notes (Signed)
Pt reports central chest pain that "feels like indigestion." Pt reports 3/10 pain.

## 2020-08-16 NOTE — Progress Notes (Signed)
Echocardiogram 2D Echocardiogram has been performed.  John Wilkerson 08/16/2020, 11:40 AM

## 2020-08-17 ENCOUNTER — Inpatient Hospital Stay (HOSPITAL_COMMUNITY): Payer: No Typology Code available for payment source

## 2020-08-17 DIAGNOSIS — I498 Other specified cardiac arrhythmias: Secondary | ICD-10-CM

## 2020-08-17 DIAGNOSIS — E785 Hyperlipidemia, unspecified: Secondary | ICD-10-CM

## 2020-08-17 DIAGNOSIS — I150 Renovascular hypertension: Secondary | ICD-10-CM

## 2020-08-17 DIAGNOSIS — F17200 Nicotine dependence, unspecified, uncomplicated: Secondary | ICD-10-CM

## 2020-08-17 LAB — BASIC METABOLIC PANEL
Anion gap: 7 (ref 5–15)
BUN: 18 mg/dL (ref 6–20)
CO2: 25 mmol/L (ref 22–32)
Calcium: 8.3 mg/dL — ABNORMAL LOW (ref 8.9–10.3)
Chloride: 104 mmol/L (ref 98–111)
Creatinine, Ser: 1.34 mg/dL — ABNORMAL HIGH (ref 0.61–1.24)
GFR, Estimated: >60 mL/min (ref >60)
Glucose, Bld: 106 mg/dL — ABNORMAL HIGH (ref 70–99)
Potassium: 4.3 mmol/L (ref 3.5–5.1)
Sodium: 136 mmol/L (ref 135–145)

## 2020-08-17 LAB — CBC
HCT: 44.7 % (ref 39.0–52.0)
Hemoglobin: 14.9 g/dL (ref 13.0–17.0)
MCH: 28.9 pg (ref 26.0–34.0)
MCHC: 33.3 g/dL (ref 30.0–36.0)
MCV: 86.8 fL (ref 80.0–100.0)
Platelets: 251 10*3/uL (ref 150–400)
RBC: 5.15 MIL/uL (ref 4.22–5.81)
RDW: 14.3 % (ref 11.5–15.5)
WBC: 13.5 10*3/uL — ABNORMAL HIGH (ref 4.0–10.5)
nRBC: 0 % (ref 0.0–0.2)

## 2020-08-17 MED ORDER — SODIUM CHLORIDE 0.9 % WEIGHT BASED INFUSION
1.0000 mL/kg/h | INTRAVENOUS | Status: DC
  Administered 2020-08-18: 1 mL/kg/h via INTRAVENOUS

## 2020-08-17 MED ORDER — NICOTINE 14 MG/24HR TD PT24
14.0000 mg | MEDICATED_PATCH | Freq: Every day | TRANSDERMAL | Status: DC
  Administered 2020-08-17 – 2020-08-21 (×5): 14 mg via TRANSDERMAL
  Filled 2020-08-17 (×5): qty 1

## 2020-08-17 MED ORDER — AMLODIPINE BESYLATE 5 MG PO TABS
5.0000 mg | ORAL_TABLET | Freq: Every day | ORAL | Status: DC
  Administered 2020-08-17: 5 mg via ORAL
  Filled 2020-08-17: qty 1

## 2020-08-17 MED ORDER — SODIUM CHLORIDE 0.9 % WEIGHT BASED INFUSION
3.0000 mL/kg/h | INTRAVENOUS | Status: AC
  Administered 2020-08-18: 3 mL/kg/h via INTRAVENOUS

## 2020-08-17 MED ORDER — AMLODIPINE BESYLATE 10 MG PO TABS
10.0000 mg | ORAL_TABLET | Freq: Every day | ORAL | Status: DC
  Administered 2020-08-18 – 2020-08-21 (×4): 10 mg via ORAL
  Filled 2020-08-17 (×4): qty 1

## 2020-08-17 MED ORDER — SODIUM CHLORIDE 0.9 % IV SOLN: 250.0000 mL | INTRAVENOUS | Status: DC | PRN

## 2020-08-17 MED ORDER — SODIUM CHLORIDE 0.9% FLUSH: 3.0000 mL | INTRAVENOUS | Status: DC | PRN

## 2020-08-17 MED ORDER — CARVEDILOL 6.25 MG PO TABS
6.2500 mg | ORAL_TABLET | Freq: Two times a day (BID) | ORAL | Status: DC
  Administered 2020-08-17: 6.25 mg via ORAL
  Filled 2020-08-17: qty 1

## 2020-08-17 MED ORDER — SODIUM CHLORIDE 0.9% FLUSH
3.0000 mL | Freq: Two times a day (BID) | INTRAVENOUS | Status: DC
  Administered 2020-08-17 – 2020-08-20 (×8): 3 mL via INTRAVENOUS

## 2020-08-17 MED ORDER — AMLODIPINE BESYLATE 5 MG PO TABS
5.0000 mg | ORAL_TABLET | Freq: Once | ORAL | Status: AC
  Administered 2020-08-17: 5 mg via ORAL
  Filled 2020-08-17: qty 1

## 2020-08-17 NOTE — Progress Notes (Signed)
Progress Note  Patient Name: John Wilkerson Date of Encounter: 08/17/2020  [facility] Cardiologist: None End  Subjective   No angina.  Continues to have frequent brief episodes of junctional rhythm, but these are no longer symptomatic as they were yesterday. Occasional brief episodes of dyspnea that he can calm down by taking 3-4 deep breaths (Brilinta related?).  Otherwise no signs of CHF. Blood pressure markedly elevated this morning, but seems to be settling down.  Inpatient Medications    Scheduled Meds: . aspirin  81 mg Oral Daily  . atorvastatin  80 mg Oral Daily  . Chlorhexidine Gluconate Cloth  6 each Topical Daily  . doxycycline  100 mg Oral Q12H  . enoxaparin (LOVENOX) injection  40 mg Subcutaneous Q24H  . metoprolol tartrate  12.5 mg Oral BID  . sodium chloride flush  3 mL Intravenous Q12H  . ticagrelor  90 mg Oral BID   Continuous Infusions: . sodium chloride 1,000 mL (08/16/20 0725)  . sodium chloride    . nitroGLYCERIN 35 mcg/min (08/17/20 0800)   PRN Meds: sodium chloride, acetaminophen, ALPRAZolam, morphine injection, nitroGLYCERIN, ondansetron (ZOFRAN) IV, sodium chloride flush   Vital Signs    Vitals:   08/17/20 0852 08/17/20 0900 08/17/20 0915 08/17/20 0930  BP: (!) 161/63 (!) 145/118 (!) 157/136 133/70  Pulse: 65 60 71 72  Resp: 16 12 15 13   Temp:      TempSrc:      SpO2: 95% 97% 97% 97%  Weight:        Intake/Output Summary (Last 24 hours) at 08/17/2020 0954 Last data filed at 08/17/2020 0800 Gross per 24 hour  Intake 896.62 ml  Output 1900 ml  Net -1003.38 ml   Last 3 Weights 08/16/2020 08/16/2008 07/18/2008  Weight (lbs) 225 lb 215 lb 214 lb  Weight (kg) 102.059 kg 97.523 kg 97.07 kg      Telemetry    Rhythm with relatively frequent brief episodes of accelerated junctional rhythm in the 50s- Personally Reviewed  ECG    Sinus rhythm with a couple of accelerated idioventricular beats, inferior Q waves with left axis deviation, inferior  ST segment now isoelectric, anterior T wave inversion, long QT- Personally Reviewed  Physical Exam  Appears comfortable GEN: No acute distress.   Neck: No JVD Cardiac: RRR, no murmurs, rubs, or gallops.  Respiratory: Clear to auscultation bilaterally. GI: Soft, nontender, non-distended  MS: No edema; No deformity.  Healthy right radial access site Neuro:  Nonfocal  Psych: Normal affect   Labs    High Sensitivity Troponin:   Recent Labs  Lab 08/16/20 0718 08/16/20 1511  TROPONINIHS 2,351* >24,000*      Chemistry Recent Labs  Lab 08/16/20 0718 08/16/20 1511 08/17/20 0133  NA 137 137 136  K 3.3* 3.6 4.3  CL 99 103 104  CO2 28 24 25   GLUCOSE 121* 101* 106*  BUN 19 16 18   CREATININE 1.30* 1.17 1.34*  CALCIUM 8.7* 8.4* 8.3*  PROT 7.9  --   --   ALBUMIN 4.1  --   --   AST 54*  --   --   ALT 25  --   --   ALKPHOS 98  --   --   BILITOT 1.2  --   --   GFRNONAA >60 >60 >60  ANIONGAP 10 10 7      Hematology Recent Labs  Lab 08/16/20 0718 08/17/20 0133  WBC 15.3* 13.5*  RBC 5.74 5.15  HGB 16.6 14.9  HCT 50.8 44.7  MCV 88.5 86.8  MCH 28.9 28.9  MCHC 32.7 33.3  RDW 14.5 14.3  PLT 283 251    BNPNo results for input(s): BNP, PROBNP in the last 168 hours.   DDimer No results for input(s): DDIMER in the last 168 hours.   Radiology    CARDIAC CATHETERIZATION  Result Date: 08/16/2020 Conclusions: 1. Multivessel coronary artery disease.  Culprit lesion for the patient's inferior STEMI is 95% thrombotic mid/distal RCA stenosis.  In addition, there is 70-80% mid LAD disease, 50% proximal LCx stenosis, and 60% distal RCA lesion extending to the bifurcation followed by moderate to severe RPDA and RPL disease. 2. Severely elevated left ventricular filling pressure (LVEDP 35 mmHg). 3. Successful PCI to mid/distal RCA using Resolute Onyx 3.5 x 18 mm drug-eluting stent (postdilated to 4.1 mm) with 0% residual stenosis and TIMI-3 flow. 4. Brachioradial artery precluding  advancement of 5F guide catheter.  Consider using alternative access for future catheterizations. Recommendations: 1. Dual antiplatelet therapy with aspirin and ticagrelor for at least 12 months. 2. Continue tirofiban infusion for 4 hours. 3. Obtain echocardiogram.  Aggressive secondary prevention, including high intensity statin therapy and smoking cessation. 4. Trend high-sensitivity troponin I until it has peaked, then stop. 5. Check hemoglobin A1c. 6. Anticipate staged PCI to LAD (could be performed this admission or shortly after discharge if patient is asymptomatic) based on hospital course. Yvonne Kendall, MD [facility]   DG Chest Port 1 View  Result Date: 08/16/2020 CLINICAL DATA:  Chest pain EXAM: PORTABLE CHEST 1 VIEW COMPARISON:  None. FINDINGS: A portion of the lung bases not visualized. There is interstitial pulmonary edema with potential underlying fibrotic type change. No air space consolidation in visualized lungs. There is questionable bullous disease in the upper lobes. Heart is mildly enlarged with pulmonary vascularity within normal limits. No evident adenopathy. No bone lesions. No evident pneumothorax. IMPRESSION: Portions of lung bases not visualized. Cardiomegaly with a degree of interstitial pulmonary edema may reflect a degree of underlying congestive heart failure. 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 Electronically Signed   By: Bretta Bang III M.D.   On: 08/16/2020 08:17   ECHOCARDIOGRAM COMPLETE  Result Date: 08/16/2020    ECHOCARDIOGRAM REPORT   Patient Name:   John Wilkerson Date of Exam: 08/16/2020 Medical Rec #:  [id number]      Height:       73.5 in Accession #:    [id number]     Weight:       225.0 lb Date of Birth:  [birth year]      BSA:          2.274 m Patient Age:    59 years       BP:           121/60 mmHg Patient Gender: M              HR:           61 bpm. Exam Location:  Inpatient Procedure: 2D Echo,  Color Doppler, Cardiac Doppler and 3D Echo Indications:    Acute MI i21.9  History:        Patient has no prior history of Echocardiogram examinations.                 CAD; Risk Factors:Hypertension.  Sonographer:    Irving Burton Senior RDCS Referring Phys: [phone] [facility] END IMPRESSIONS  1. Left ventricular ejection fraction, by estimation, is  50 to 55%. The left ventricle has low normal function. The left ventricle has no regional wall motion abnormalities. There is severe left ventricular hypertrophy. Left ventricular diastolic parameters are indeterminate, but suggestive of restrictive diastolic filling pattern.  2. Right ventricular systolic function is normal. The right ventricular size is normal. Tricuspid regurgitation signal is inadequate for assessing PA pressure.  3. The mitral valve is grossly normal. Mild mitral valve regurgitation.  4. The aortic valve is tricuspid. Aortic valve regurgitation is not visualized.  5. The inferior vena cava is normal in size with greater than 50% respiratory variability, suggesting right atrial pressure of 3 mmHg. FINDINGS  Left Ventricle: Left ventricular ejection fraction, by estimation, is 50 to 55%. The left ventricle has low normal function. The left ventricle has no regional wall motion abnormalities. The left ventricular internal cavity size was normal in size. There is severe left ventricular hypertrophy. Left ventricular diastolic parameters are indeterminate. Right Ventricle: The right ventricular size is normal. No increase in right ventricular wall thickness. Right ventricular systolic function is normal. Tricuspid regurgitation signal is inadequate for assessing PA pressure. Left Atrium: Left atrial size was normal in size. Right Atrium: Right atrial size was normal in size. Pericardium: There is no evidence of pericardial effusion. Mitral Valve: The mitral valve is grossly normal. Mild mitral annular calcification. Mild mitral valve regurgitation. Tricuspid  Valve: The tricuspid valve is grossly normal. Tricuspid valve regurgitation is trivial. Aortic Valve: The aortic valve is tricuspid. There is mild aortic valve annular calcification. Aortic valve regurgitation is not visualized. Pulmonic Valve: The pulmonic valve was grossly normal. Pulmonic valve regurgitation is trivial. Aorta: The aortic root is normal in size and structure. Venous: The inferior vena cava is normal in size with greater than 50% respiratory variability, suggesting right atrial pressure of 3 mmHg. IAS/Shunts: No atrial level shunt detected by color flow Doppler.  LEFT VENTRICLE PLAX 2D LVIDd:         5.30 cm  Diastology LVIDs:         3.75 cm  LV e' medial:    5.66 cm/s LV PW:         1.60 cm  LV E/e' medial:  18.1 LV IVS:        1.30 cm  LV e' lateral:   5.00 cm/s LVOT diam:     2.20 cm  LV E/e' lateral: 20.5 LV SV:         60 LV SV Index:   26 LVOT Area:     3.80 cm  RIGHT VENTRICLE RV S prime:     15.10 cm/s TAPSE (M-mode): 2.6 cm LEFT ATRIUM             Index       RIGHT ATRIUM           Index LA diam:        4.45 cm 1.96 cm/m  RA Area:     18.60 cm LA Vol (A2C):   68.4 ml 30.08 ml/m RA Volume:   52.50 ml  23.09 ml/m LA Vol (A4C):   58.9 ml 25.91 ml/m LA Biplane Vol: 64.7 ml 28.46 ml/m  AORTIC VALVE LVOT Vmax:   91.40 cm/s LVOT Vmean:  61.400 cm/s LVOT VTI:    0.157 m  AORTA Ao Root diam: 3.40 cm Ao Asc diam:  3.40 cm MITRAL VALVE MV Area (PHT): 3.42 cm     SHUNTS MV Decel Time: 222 msec     Systemic VTI:  0.16  m MV E velocity: 102.50 cm/s  Systemic Diam: 2.20 cm MV A velocity: 50.55 cm/s MV E/A ratio:  2.03 Nona DellSamuel Mcdowell MD Electronically signed by Nona DellSamuel Mcdowell MD Signature Date/Time: 08/16/2020/1:15:15 PM    Final     Cardiac Studies   Echocardiogram 08/16/2020  1. Left ventricular ejection fraction, by estimation, is 50 to 55%. The  left ventricle has low normal function. The left ventricle has no regional  wall motion abnormalities. There is severe left ventricular  hypertrophy.  Left ventricular diastolic  parameters are indeterminate, but suggestive of restrictive diastolic  filling pattern.  2. Right ventricular systolic function is normal. The right ventricular  size is normal. Tricuspid regurgitation signal is inadequate for assessing  PA pressure.  3. The mitral valve is grossly normal. Mild mitral valve regurgitation.  4. The aortic valve is tricuspid. Aortic valve regurgitation is not  visualized.  5. The inferior vena cava is normal in size with greater than 50%  respiratory variability, suggesting right atrial pressure of 3 mmHg.   Cardiac catheterization 08/16/2020  Conclusions: 1. Multivessel coronary artery disease.  Culprit lesion for the patient's inferior STEMI is 95% thrombotic mid/distal RCA stenosis.  In addition, there is 70-80% mid LAD disease, 50% proximal LCx stenosis, and 60% distal RCA lesion extending to the bifurcation followed by moderate to severe RPDA and RPL disease. 2. Severely elevated left ventricular filling pressure (LVEDP 35 mmHg). 3. Successful PCI to mid/distal RCA using Resolute Onyx 3.5 x 18 mm drug-eluting stent (postdilated to 4.1 mm) with 0% residual stenosis and TIMI-3 flow. 4. Brachioradial artery precluding advancement of 18F guide catheter.  Consider using alternative access for future catheterizations.  Recommendations: 1. Dual antiplatelet therapy with aspirin and ticagrelor for at least 12 months. 2. Continue tirofiban infusion for 4 hours. 3. Obtain echocardiogram.  Aggressive secondary prevention, including high intensity statin therapy and smoking cessation. 4. Trend high-sensitivity troponin I until it has peaked, then stop. 5. Check hemoglobin A1c. 6. Anticipate staged PCI to LAD (could be performed this admission or shortly after discharge if patient is asymptomatic) based on hospital course.  Diagnostic Dominance: Right    Intervention     Implants    Permanent Stent    Stent Resolute Onyx 3.5x18 - [id number] - Implanted      Patient Profile     60 y.o. male smoker without recent medical care presenting with acute inferior ST segment elevation myocardial infarction found to also have severe LAD stenosis.  Underwent emergency PCI-DES of RCA with plan for staged LAD PCI.  Assessment & Plan    1. CAD s/p inf STEMI and PCI-DES RCA: Currently asymptomatic.  Plan for staged PCI of LAD stenosis in AM.  No difficulty with catheter manipulation via the radial sheath, may do better with a femoral approach for the LAD procedure. 2.  Junctional rhythm: Yesterday he had recurrent episodes of AIVR which was symptomatic, now having episodes of accelerated junctional rhythm which is not symptomatic. 3. HTN: Severe hypertension requiring rapid escalation of nitroglycerin intravenously this morning, but now settling down.  Raises question of possible renal artery stenosis.  We will schedule renal duplex ultrasound.  For now avoid RAAS inhibitors since he is going to have another contrast based procedure in the morning.  Add amlodipine, switch to carvedilol, try to wean off nitroglycerin. 4.  Dyspnea: Episodic dyspnea at rest that resolved spontaneously and quickly.  I wonder whether this is a side effect of the Brilinta.  He seems to be  handling it okay. 5. HLP: Baseline LDL 153, with low HDL, mildly elevated triglycerides, suggestive of a highly disadvantageous lipid profile consistent with metabolic syndrome and small dense LDL.  On high-dose atorvastatin. 6. Smoking: He appears very interested in quitting smoking.  We will add a nicotine patch today.  I wonder whether nicotine withdrawal may be playing a role in his elevated blood pressure as well.  For questions or updates, please contact [facility] Please consult [email or web address] for contact info under        Signed, Thurmon Fair, MD  08/17/2020, 9:54 AM

## 2020-08-17 NOTE — Plan of Care (Signed)
  Problem: Education: Goal: Knowledge of General Education information will improve Description: Including pain rating scale, medication(s)/side effects and non-pharmacologic comfort measures Outcome: Progressing   Problem: Clinical Measurements: Goal: Ability to maintain clinical measurements within normal limits will improve Outcome: Progressing Goal: Diagnostic test results will improve Outcome: Progressing Goal: Respiratory complications will improve Outcome: Progressing   Problem: Coping: Goal: Level of anxiety will decrease Outcome: Progressing   Problem: Elimination: Goal: Will not experience complications related to urinary retention Outcome: Progressing   Problem: Pain Managment: Goal: General experience of comfort will improve Outcome: Progressing

## 2020-08-17 NOTE — Progress Notes (Signed)
Dr. Royann Shivers notified of patient's continued elevated BP and continued c/o dyspnea. MD still believes dyspnea is related to brilinta and does not want to make changes at this time, specifically since patient will be re-cathed tomorrow. PCXR ordered. Also ordered x1 additional dose of norvasc for elevated BP. Will continue to titrate nitro.  Leanna Battles, RN

## 2020-08-18 ENCOUNTER — Encounter (HOSPITAL_COMMUNITY): Payer: Self-pay | Admitting: Internal Medicine

## 2020-08-18 ENCOUNTER — Inpatient Hospital Stay (HOSPITAL_COMMUNITY): Payer: No Typology Code available for payment source

## 2020-08-18 ENCOUNTER — Other Ambulatory Visit (HOSPITAL_COMMUNITY): Payer: Self-pay

## 2020-08-18 ENCOUNTER — Telehealth: Payer: Self-pay | Admitting: Licensed Clinical Social Worker

## 2020-08-18 DIAGNOSIS — I150 Renovascular hypertension: Secondary | ICD-10-CM

## 2020-08-18 DIAGNOSIS — I110 Hypertensive heart disease with heart failure: Secondary | ICD-10-CM

## 2020-08-18 DIAGNOSIS — E7800 Pure hypercholesterolemia, unspecified: Secondary | ICD-10-CM

## 2020-08-18 DIAGNOSIS — N182 Chronic kidney disease, stage 2 (mild): Secondary | ICD-10-CM | POA: Diagnosis present

## 2020-08-18 LAB — BASIC METABOLIC PANEL
Anion gap: 8 (ref 5–15)
Anion gap: 9 (ref 5–15)
BUN: 19 mg/dL (ref 6–20)
BUN: 20 mg/dL (ref 6–20)
CO2: 25 mmol/L (ref 22–32)
CO2: 22 mmol/L (ref 22–32)
Calcium: 8.2 mg/dL — ABNORMAL LOW (ref 8.9–10.3)
Calcium: 8.3 mg/dL — ABNORMAL LOW (ref 8.9–10.3)
Chloride: 103 mmol/L (ref 98–111)
Chloride: 104 mmol/L (ref 98–111)
Creatinine, Ser: 1.33 mg/dL — ABNORMAL HIGH (ref 0.61–1.24)
Creatinine, Ser: 1.31 mg/dL — ABNORMAL HIGH (ref 0.61–1.24)
GFR, Estimated: >60 mL/min (ref >60)
GFR, Estimated: >60 mL/min (ref >60)
Glucose, Bld: 107 mg/dL — ABNORMAL HIGH (ref 70–99)
Glucose, Bld: 119 mg/dL — ABNORMAL HIGH (ref 70–99)
Potassium: 3.4 mmol/L — ABNORMAL LOW (ref 3.5–5.1)
Potassium: 3.6 mmol/L (ref 3.5–5.1)
Sodium: 136 mmol/L (ref 135–145)
Sodium: 135 mmol/L (ref 135–145)

## 2020-08-18 LAB — AMMONIA: Ammonia: 33 umol/L (ref 9–35)

## 2020-08-18 LAB — CBC
HCT: 40.5 % (ref 39.0–52.0)
Hemoglobin: 13.7 g/dL (ref 13.0–17.0)
MCH: 28.7 pg (ref 26.0–34.0)
MCHC: 33.8 g/dL (ref 30.0–36.0)
MCV: 84.9 fL (ref 80.0–100.0)
Platelets: 211 10*3/uL (ref 150–400)
RBC: 4.77 MIL/uL (ref 4.22–5.81)
RDW: 13.9 % (ref 11.5–15.5)
WBC: 12.7 10*3/uL — ABNORMAL HIGH (ref 4.0–10.5)
nRBC: 0 % (ref 0.0–0.2)

## 2020-08-18 LAB — BRAIN NATRIURETIC PEPTIDE: B Natriuretic Peptide: 851.5 pg/mL — ABNORMAL HIGH (ref 0.0–100.0)

## 2020-08-18 LAB — BLOOD GAS, VENOUS
Acid-Base Excess: 1.9 mmol/L (ref 0.0–2.0)
Bicarbonate: 25.7 mmol/L (ref 20.0–28.0)
FIO2: 21
O2 Saturation: 41.1 %
Patient temperature: 37
pCO2, Ven: 38.6 mmHg — ABNORMAL LOW (ref 44.0–60.0)
pH, Ven: 7.438 — ABNORMAL HIGH (ref 7.250–7.430)
pO2, Ven: <31 mmHg — CL (ref 32.0–45.0)

## 2020-08-18 LAB — HEMOGLOBIN A1C
Hgb A1c MFr Bld: 5.6 % (ref 4.8–5.6)
Hgb A1c MFr Bld: 5.6 % (ref 4.8–5.6)
Mean Plasma Glucose: 114 mg/dL
Mean Plasma Glucose: 114 mg/dL

## 2020-08-18 MED ORDER — FUROSEMIDE 10 MG/ML IJ SOLN
40.0000 mg | Freq: Once | INTRAMUSCULAR | Status: AC
  Administered 2020-08-18: 40 mg via INTRAVENOUS

## 2020-08-18 MED ORDER — HALOPERIDOL LACTATE 5 MG/ML IJ SOLN: 2.0000 mg | Freq: Four times a day (QID) | INTRAMUSCULAR | Status: DC | PRN

## 2020-08-18 MED ORDER — CARVEDILOL 12.5 MG PO TABS
12.5000 mg | ORAL_TABLET | Freq: Two times a day (BID) | ORAL | Status: DC
  Administered 2020-08-18 – 2020-08-21 (×7): 12.5 mg via ORAL
  Filled 2020-08-18 (×7): qty 1

## 2020-08-18 MED ORDER — HALOPERIDOL 1 MG PO TABS
2.0000 mg | ORAL_TABLET | Freq: Four times a day (QID) | ORAL | Status: DC | PRN
  Administered 2020-08-18: 2 mg via ORAL
  Filled 2020-08-18 (×3): qty 2

## 2020-08-18 MED ORDER — OXYMETAZOLINE HCL 0.05 % NA SOLN
1.0000 | Freq: Two times a day (BID) | NASAL | Status: DC
  Administered 2020-08-18 – 2020-08-19 (×3): 1 via NASAL
  Filled 2020-08-18: qty 30

## 2020-08-18 MED ORDER — ALPRAZOLAM 0.5 MG PO TABS
0.5000 mg | ORAL_TABLET | Freq: Once | ORAL | Status: AC
  Administered 2020-08-18: 0.5 mg via ORAL
  Filled 2020-08-18: qty 1

## 2020-08-18 MED ORDER — ENOXAPARIN SODIUM 40 MG/0.4ML IJ SOSY
40.0000 mg | PREFILLED_SYRINGE | INTRAMUSCULAR | Status: DC
  Administered 2020-08-18: 40 mg via SUBCUTANEOUS
  Filled 2020-08-18: qty 0.4

## 2020-08-18 MED ORDER — POTASSIUM CHLORIDE CRYS ER 20 MEQ PO TBCR
40.0000 meq | EXTENDED_RELEASE_TABLET | Freq: Once | ORAL | Status: AC
  Administered 2020-08-18: 40 meq via ORAL
  Filled 2020-08-18: qty 2

## 2020-08-18 MED ORDER — FUROSEMIDE 10 MG/ML IJ SOLN
40.0000 mg | Freq: Two times a day (BID) | INTRAMUSCULAR | Status: DC
  Filled 2020-08-18: qty 4

## 2020-08-18 MED ORDER — FUROSEMIDE 10 MG/ML IJ SOLN
40.0000 mg | Freq: Two times a day (BID) | INTRAMUSCULAR | Status: DC
  Administered 2020-08-18: 40 mg via INTRAVENOUS
  Filled 2020-08-18: qty 4

## 2020-08-18 NOTE — Progress Notes (Signed)
[facility]  Patient evaluated in anticipation of staged PCI to LAD today.  He was noted to be somnolent but anxious-appearing when aroused with intermittent gasping for breath.  He denies chest pain but feels uneasy and short of breath.  His RN and wife report that breathing seemed to worsen around 11 AM (he was started on IVF this AM for cath and also received ticagrelor around 9:30).  Tele shows continued intermittent junctional rhythm vs AIVR.  Case d/w Dr. [place].  We will defer PCI today in order to optimize the patient's respiratory status.  I will stop IVF and start furosemide 40 mg IV BID.  We will also check a VBG to exclude CO2 retention, given long history of smoking, respiratory disturbance, and wife's concern about the patient seeming somewhat confused.  If dyspnea persists despite optimization of volume status and workup for respiratory cause, transition from ticagrelor to prasugrel or clopidogrel will need to be considered.  Yvonne Kendall, MD [facility] 08/18/20 3:01 PM

## 2020-08-18 NOTE — Telephone Encounter (Signed)
Received a referral from Leota Sauers, PharmD, pt currently inpatient and will have f/u care with [facility] at discharge. She notes that pt does not currently have a PCP. Pt resides in [place]. Our team is not able to make specific recommendations for PCP providers, I have collaborated w/ inpatient LCSWA Revonda Standard who will be sent PCP lists and will provide them to pt.   Octavio Graves, MSW, LCSW [facility] Navigation  [phone]

## 2020-08-18 NOTE — Progress Notes (Signed)
Renal artery duplex study completed.   Please see CV Proc for preliminary results.   Garnie Borchardt, RDMS, RVT  

## 2020-08-18 NOTE — Progress Notes (Signed)
Progress Note  Patient Name: John Wilkerson Date of Encounter: 08/18/2020  [facility] Cardiologist: None End  Subjective   No angina.  No dyspnea. Some brief episodes of idioventricular versus junctional rhythm  Inpatient Medications    Scheduled Meds: . amLODipine  10 mg Oral Daily  . aspirin  81 mg Oral Daily  . atorvastatin  80 mg Oral Daily  . carvedilol  12.5 mg Oral BID WC  . Chlorhexidine Gluconate Cloth  6 each Topical Daily  . doxycycline  100 mg Oral Q12H  . enoxaparin (LOVENOX) injection  40 mg Subcutaneous Q24H  . nicotine  14 mg Transdermal Daily  . sodium chloride flush  3 mL Intravenous Q12H  . sodium chloride flush  3 mL Intravenous Q12H  . ticagrelor  90 mg Oral BID   Continuous Infusions: . sodium chloride 1,000 mL (08/16/20 0725)  . sodium chloride    . sodium chloride    . sodium chloride 1 mL/kg/hr (08/18/20 0500)  . nitroGLYCERIN 100 mcg/min (08/18/20 0610)   PRN Meds: sodium chloride, sodium chloride, acetaminophen, morphine injection, nitroGLYCERIN, ondansetron (ZOFRAN) IV, sodium chloride flush, sodium chloride flush   Vital Signs    Vitals:   08/18/20 0300 08/18/20 0400 08/18/20 0500 08/18/20 0600  BP: 136/72 130/84 (!) 148/68 127/66  Pulse: 75 65 70 67  Resp: 14 17 20 16   Temp:  98.5 F (36.9 C)    TempSrc:  Oral    SpO2: 97% 97% 96% 96%  Weight:        Intake/Output Summary (Last 24 hours) at 08/18/2020 0813 Last data filed at 08/18/2020 0600 Gross per 24 hour  Intake 1252.3 ml  Output 1800 ml  Net -547.7 ml   Last 3 Weights 08/16/2020 08/16/2008 07/18/2008  Weight (lbs) 225 lb 215 lb 214 lb  Weight (kg) 102.059 kg 97.523 kg 97.07 kg      Telemetry    Rhythm NSR. Rate 70s. - Personally Reviewed  ECG    Sinus rhythm with a couple of accelerated idioventricular beats, inferior Q waves with left axis deviation, inferior ST segment now isoelectric, anterior T wave inversion, long QT- Personally Reviewed  Physical Exam    Appears comfortable GEN: No acute distress.   Neck: No JVD Cardiac: RRR, no murmurs, rubs, or gallops.  Respiratory: Clear to auscultation bilaterally. GI: Soft, nontender, non-distended  MS: No edema; No deformity.  Healthy right radial access site Neuro:  Nonfocal  Psych: Normal affect   Labs    High Sensitivity Troponin:   Recent Labs  Lab 08/16/20 0718 08/16/20 1511  TROPONINIHS 2,351* >24,000*      Chemistry Recent Labs  Lab 08/16/20 0718 08/16/20 1511 08/17/20 0133  NA 137 137 136  K 3.3* 3.6 4.3  CL 99 103 104  CO2 28 24 25   GLUCOSE 121* 101* 106*  BUN 19 16 18   CREATININE 1.30* 1.17 1.34*  CALCIUM 8.7* 8.4* 8.3*  PROT 7.9  --   --   ALBUMIN 4.1  --   --   AST 54*  --   --   ALT 25  --   --   ALKPHOS 98  --   --   BILITOT 1.2  --   --   GFRNONAA >60 >60 >60  ANIONGAP 10 10 7      Hematology Recent Labs  Lab 08/16/20 0718 08/17/20 0133 08/18/20 0745  WBC 15.3* 13.5* 12.7*  RBC 5.74 5.15 4.77  HGB 16.6 14.9 13.7  HCT  50.8 44.7 40.5  MCV 88.5 86.8 84.9  MCH 28.9 28.9 28.7  MCHC 32.7 33.3 33.8  RDW 14.5 14.3 13.9  PLT 283 251 211    BNPNo results for input(s): BNP, PROBNP in the last 168 hours.   DDimer No results for input(s): DDIMER in the last 168 hours.   Radiology    CARDIAC CATHETERIZATION  Result Date: 08/16/2020 Conclusions: 1. Multivessel coronary artery disease.  Culprit lesion for the patient's inferior STEMI is 95% thrombotic mid/distal RCA stenosis.  In addition, there is 70-80% mid LAD disease, 50% proximal LCx stenosis, and 60% distal RCA lesion extending to the bifurcation followed by moderate to severe RPDA and RPL disease. 2. Severely elevated left ventricular filling pressure (LVEDP 35 mmHg). 3. Successful PCI to mid/distal RCA using Resolute Onyx 3.5 x 18 mm drug-eluting stent (postdilated to 4.1 mm) with 0% residual stenosis and TIMI-3 flow. 4. Brachioradial artery precluding advancement of 52F guide catheter.  Consider  using alternative access for future catheterizations. Recommendations: 1. Dual antiplatelet therapy with aspirin and ticagrelor for at least 12 months. 2. Continue tirofiban infusion for 4 hours. 3. Obtain echocardiogram.  Aggressive secondary prevention, including high intensity statin therapy and smoking cessation. 4. Trend high-sensitivity troponin I until it has peaked, then stop. 5. Check hemoglobin A1c. 6. Anticipate staged PCI to LAD (could be performed this admission or shortly after discharge if patient is asymptomatic) based on hospital course. Yvonne Kendall, MD [facility]   DG Chest Port 1 View  Result Date: 08/17/2020 CLINICAL DATA:  Dyspnea EXAM: PORTABLE CHEST 1 VIEW COMPARISON:  Portable exam 1602 hours compared to 08/16/2020 FINDINGS: Enlargement of cardiac silhouette. Mediastinal contours normal. Airspace infiltrates in the mid to upper lungs, increased, could represent pneumonia or pulmonary edema. No pleural effusion or pneumothorax. Osseous structures unremarkable. IMPRESSION: Increased infiltrates in the mid to upper lungs question multifocal pneumonia versus pulmonary edema. Electronically Signed   By: Ulyses Southward M.D.   On: 08/17/2020 16:11   ECHOCARDIOGRAM COMPLETE  Result Date: 08/16/2020    ECHOCARDIOGRAM REPORT   Patient Name:   John George Date of Exam: 08/16/2020 Medical Rec #:  [id number]      Height:       73.5 in Accession #:    [id number]     Weight:       225.0 lb Date of Birth:  Jul 29, 1960      BSA:          2.274 m Patient Age:    59 years       BP:           121/60 mmHg Patient Gender: M              HR:           61 bpm. Exam Location:  Inpatient Procedure: 2D Echo, Color Doppler, Cardiac Doppler and 3D Echo Indications:    Acute MI i21.9  History:        Patient has no prior history of Echocardiogram examinations.                 CAD; Risk Factors:Hypertension.  Sonographer:    Irving Burton Senior RDCS Referring Phys: [phone] CHRISTOPHER END IMPRESSIONS  1. Left ventricular  ejection fraction, by estimation, is 50 to 55%. The left ventricle has low normal function. The left ventricle has no regional wall motion abnormalities. There is severe left ventricular hypertrophy. Left ventricular diastolic parameters are indeterminate, but suggestive of restrictive diastolic filling pattern.  2. Right ventricular systolic function is normal. The right ventricular size is normal. Tricuspid regurgitation signal is inadequate for assessing PA pressure.  3. The mitral valve is grossly normal. Mild mitral valve regurgitation.  4. The aortic valve is tricuspid. Aortic valve regurgitation is not visualized.  5. The inferior vena cava is normal in size with greater than 50% respiratory variability, suggesting right atrial pressure of 3 mmHg. FINDINGS  Left Ventricle: Left ventricular ejection fraction, by estimation, is 50 to 55%. The left ventricle has low normal function. The left ventricle has no regional wall motion abnormalities. The left ventricular internal cavity size was normal in size. There is severe left ventricular hypertrophy. Left ventricular diastolic parameters are indeterminate. Right Ventricle: The right ventricular size is normal. No increase in right ventricular wall thickness. Right ventricular systolic function is normal. Tricuspid regurgitation signal is inadequate for assessing PA pressure. Left Atrium: Left atrial size was normal in size. Right Atrium: Right atrial size was normal in size. Pericardium: There is no evidence of pericardial effusion. Mitral Valve: The mitral valve is grossly normal. Mild mitral annular calcification. Mild mitral valve regurgitation. Tricuspid Valve: The tricuspid valve is grossly normal. Tricuspid valve regurgitation is trivial. Aortic Valve: The aortic valve is tricuspid. There is mild aortic valve annular calcification. Aortic valve regurgitation is not visualized. Pulmonic Valve: The pulmonic valve was grossly normal. Pulmonic valve  regurgitation is trivial. Aorta: The aortic root is normal in size and structure. Venous: The inferior vena cava is normal in size with greater than 50% respiratory variability, suggesting right atrial pressure of 3 mmHg. IAS/Shunts: No atrial level shunt detected by color flow Doppler.  LEFT VENTRICLE PLAX 2D LVIDd:         5.30 cm  Diastology LVIDs:         3.75 cm  LV e' medial:    5.66 cm/s LV PW:         1.60 cm  LV E/e' medial:  18.1 LV IVS:        1.30 cm  LV e' lateral:   5.00 cm/s LVOT diam:     2.20 cm  LV E/e' lateral: 20.5 LV SV:         60 LV SV Index:   26 LVOT Area:     3.80 cm  RIGHT VENTRICLE RV S prime:     15.10 cm/s TAPSE (M-mode): 2.6 cm LEFT ATRIUM             Index       RIGHT ATRIUM           Index LA diam:        4.45 cm 1.96 cm/m  RA Area:     18.60 cm LA Vol (A2C):   68.4 ml 30.08 ml/m RA Volume:   52.50 ml  23.09 ml/m LA Vol (A4C):   58.9 ml 25.91 ml/m LA Biplane Vol: 64.7 ml 28.46 ml/m  AORTIC VALVE LVOT Vmax:   91.40 cm/s LVOT Vmean:  61.400 cm/s LVOT VTI:    0.157 m  AORTA Ao Root diam: 3.40 cm Ao Asc diam:  3.40 cm MITRAL VALVE MV Area (PHT): 3.42 cm     SHUNTS MV Decel Time: 222 msec     Systemic VTI:  0.16 m MV E velocity: 102.50 cm/s  Systemic Diam: 2.20 cm MV A velocity: 50.55 cm/s MV E/A ratio:  2.03 Nona Dell MD Electronically signed by Nona Dell MD Signature Date/Time: 08/16/2020/1:15:15 PM    Final  Cardiac Studies   Echocardiogram 08/16/2020  1. Left ventricular ejection fraction, by estimation, is 50 to 55%. The  left ventricle has low normal function. The left ventricle has no regional  wall motion abnormalities. There is severe left ventricular hypertrophy.  Left ventricular diastolic  parameters are indeterminate, but suggestive of restrictive diastolic  filling pattern.  2. Right ventricular systolic function is normal. The right ventricular  size is normal. Tricuspid regurgitation signal is inadequate for assessing  PA pressure.   3. The mitral valve is grossly normal. Mild mitral valve regurgitation.  4. The aortic valve is tricuspid. Aortic valve regurgitation is not  visualized.  5. The inferior vena cava is normal in size with greater than 50%  respiratory variability, suggesting right atrial pressure of 3 mmHg.   Cardiac catheterization 08/16/2020  Conclusions: 1. Multivessel coronary artery disease.  Culprit lesion for the patient's inferior STEMI is 95% thrombotic mid/distal RCA stenosis.  In addition, there is 70-80% mid LAD disease, 50% proximal LCx stenosis, and 60% distal RCA lesion extending to the bifurcation followed by moderate to severe RPDA and RPL disease. 2. Severely elevated left ventricular filling pressure (LVEDP 35 mmHg). 3. Successful PCI to mid/distal RCA using Resolute Onyx 3.5 x 18 mm drug-eluting stent (postdilated to 4.1 mm) with 0% residual stenosis and TIMI-3 flow. 4. Brachioradial artery precluding advancement of 48F guide catheter.  Consider using alternative access for future catheterizations.  Recommendations: 1. Dual antiplatelet therapy with aspirin and ticagrelor for at least 12 months. 2. Continue tirofiban infusion for 4 hours. 3. Obtain echocardiogram.  Aggressive secondary prevention, including high intensity statin therapy and smoking cessation. 4. Trend high-sensitivity troponin I until it has peaked, then stop. 5. Check hemoglobin A1c. 6. Anticipate staged PCI to LAD (could be performed this admission or shortly after discharge if patient is asymptomatic) based on hospital course.  Diagnostic Dominance: Right    Intervention     Implants    Permanent Stent   Stent Resolute Onyx 3.5x18 - [id number] - Implanted      Patient Profile     60 y.o. male smoker without recent medical care presenting with acute inferior ST segment elevation myocardial infarction found to also have severe LAD stenosis.  Underwent emergency PCI-DES of RCA with plan for staged  LAD PCI.  Assessment & Plan    1. CAD s/p inf STEMI and PCI-DES RCA: Troponin > 24K. Ecg shows Q waves inferiorly. EF 50% by Echo. Currently asymptomatic.  Plan for staged PCI of LAD stenosis today. He has diffuse residual disease in the distal RCA branches that we will treat medically.  No difficulty with catheter manipulation via the radial sheath, may do better with a femoral approach for the LAD procedure. 2.  Junctional rhythm: Yesterday he had recurrent episodes of AIVR which was symptomatic, much improved.  3. HTN with hypertensive heart disease: Severe hypertension probably chronic.  Severe LVH on Echo. Raises question of possible renal artery stenosis.  We will schedule renal duplex ultrasound.  For now avoid RAAS inhibitors since he has CKD.  On  Amlodipine 10 mg daily, will increase carvedilol to 12.5 mg bid, try to wean off nitroglycerin. 4.  CKD. Creatinine 1.3 on admission. Now 1.34. likely due to chronic severe HTN. Will monitor. Avoid ACEi/ARB/Diuretics for now.  5. HLP: Baseline LDL 153, with low HDL, mildly elevated triglycerides, suggestive of a highly disadvantageous lipid profile consistent with metabolic syndrome and small dense LDL.  On high-dose atorvastatin. 6. Smoking: He is  very motivated to quit smoking.  We will add a nicotine patch today.    For questions or updates, please contact [facility] Please consult [email or web address] for contact info under        Signed, Kylinn Shropshire [place], MD  08/18/2020, 8:13 AM

## 2020-08-18 NOTE — Progress Notes (Signed)
CARDIAC REHAB PHASE I   Began MI education with pt and wife. Pt educated on importance of ASA and Brilinta. Pt given stent card, MI book, heart healthy diet, and smoking cessation tip sheet. Reviewed site care and restrictions. Pt c/o some SOB related to Brilinta, encouraged pt to take with some caffeine. For staged intervention today. Will f/u tomorrow to complete education and ambulate.  [id number] Reynold Bowen, RN BSN 08/18/2020 9:30 AM

## 2020-08-18 NOTE — Progress Notes (Signed)
    S: Contacted by RN re: acute agitation earlier this evening.  Pt reportedly disoriented after awakening and pulled out his IVs.  He demanded to leave.  Wife was at bedside.  Upon my arrival, acute delirium seems to have cleared.  Pt currently oriented to place, cooperative, and allowing for replacement of IV.  O:   Vitals:   08/18/20 1900 08/18/20 1930  BP:    Pulse: 77 75  Resp: (!) 29 19  Temp:    SpO2: 91% 93%   A/P: 1.  Acute delirium:  Pt w/ acute disorientation and agitation earlier this evening.  Notes indicate that he had been somnolent and confused.  RN notes that pt was sleeping and awoke very confused and agitated.  He had pulled out his IVs and tried to leave.  IM haldol ordered, however, prior to receiving a dose, pts mental status improved.  He is now calm and cooperative.  BMET, ammonia, ABG pending.  Will place order for head CT given somnolence, confusion, agitation, and delirium w/ recent cath on 6/4.  PRN haldol (PO/IM/IV) ordered in case of recurrent delirium.  Discussed w/ nsg staff.  Nicolasa Ducking, NP

## 2020-08-18 NOTE — TOC Benefit Eligibility Note (Addendum)
Patient Product/process development scientist completed.    The patient is currently admitted and upon discharge could be taking Brilinta 90 mg.  The current 30 day co-pay is, $24.99.   The patient is currently admitted and upon discharge could be taking Entresto 24mg /26mg  mg.  The current 30 day co-pay is, $55.00.   The patient is currently admitted and upon discharge could be taking Jardiance 10 mg.  The current 30 day co-pay is, $0.00.   The patient is currently admitted and upon discharge could be taking Farxiga 10 mg.  The current 30 day co-pay is, $0.00.   The patient is insured through    [employer], CPhT Pharmacy Patient Advocate Specialist [facility] Health Antimicrobial Stewardship Team Direct Number: [phone]  Fax: [phone]

## 2020-08-18 NOTE — Progress Notes (Addendum)
Patient awoke from sleep around 1900 disoriented x 4 and stating he wanted to leave to go home. Patient's RN and wife at bedside, RN tried to reorient patient and explain his medical condition. Patient remained confused and began removing medical equipment, gown, and ultimately removed PIV despite nursing staff's best efforts. Nicolasa Ducking NP was paged and informed of patient's status. Orders were given for PRN Haldol as well as labs, ABG, and head CT. By the time I was able to pull the Haldol, the patient had cleared up mentally and was oriented x 4 and cooperative. Haldol held for now d/t change in patient status. Patient allowed new IV access to be established and for labs to be drawn. Report given to night shift RN, Gevena Barre.  [employer] RN

## 2020-08-19 DIAGNOSIS — I5043 Acute on chronic combined systolic (congestive) and diastolic (congestive) heart failure: Secondary | ICD-10-CM

## 2020-08-19 DIAGNOSIS — N179 Acute kidney failure, unspecified: Secondary | ICD-10-CM

## 2020-08-19 DIAGNOSIS — N189 Chronic kidney disease, unspecified: Secondary | ICD-10-CM

## 2020-08-19 LAB — BASIC METABOLIC PANEL
Anion gap: 9 (ref 5–15)
BUN: 21 mg/dL — ABNORMAL HIGH (ref 6–20)
CO2: 25 mmol/L (ref 22–32)
Calcium: 8 mg/dL — ABNORMAL LOW (ref 8.9–10.3)
Chloride: 103 mmol/L (ref 98–111)
Creatinine, Ser: 1.51 mg/dL — ABNORMAL HIGH (ref 0.61–1.24)
GFR, Estimated: 53 mL/min — ABNORMAL LOW (ref >60)
Glucose, Bld: 102 mg/dL — ABNORMAL HIGH (ref 70–99)
Potassium: 3.3 mmol/L — ABNORMAL LOW (ref 3.5–5.1)
Sodium: 137 mmol/L (ref 135–145)

## 2020-08-19 MED ORDER — POTASSIUM CHLORIDE CRYS ER 20 MEQ PO TBCR
40.0000 meq | EXTENDED_RELEASE_TABLET | Freq: Once | ORAL | Status: AC
  Administered 2020-08-19: 40 meq via ORAL
  Filled 2020-08-19: qty 2

## 2020-08-19 MED ORDER — SODIUM CHLORIDE 0.9 % IV SOLN: INTRAVENOUS | Status: DC

## 2020-08-19 NOTE — Progress Notes (Signed)
 Progress Note  Patient Name: John Wilkerson Date of Encounter: 08/19/2020  [facility] Cardiologist: None End  Subjective   No angina.  No dyspnea today. PCI of LAD cancelled yesterday due to increasing SOB and agitation. Somewhat sedated this am. Noted acute confusion, agitation last pm. Patient pulled out IVs and tried to leave. This lasted for < 30 minutes then according to nurse resolved. He did receive Haldol and CT head done.   Inpatient Medications    Scheduled Meds: . amLODipine  10 mg Oral Daily  . aspirin  81 mg Oral Daily  . atorvastatin  80 mg Oral Daily  . carvedilol  12.5 mg Oral BID WC  . Chlorhexidine Gluconate Cloth  6 each Topical Daily  . doxycycline  100 mg Oral Q12H  . nicotine  14 mg Transdermal Daily  . oxymetazoline  1 spray Each Nare BID  . sodium chloride flush  3 mL Intravenous Q12H  . sodium chloride flush  3 mL Intravenous Q12H  . ticagrelor  90 mg Oral BID   Continuous Infusions: . sodium chloride 1,000 mL (08/16/20 0725)  . sodium chloride    . sodium chloride    . sodium chloride 10 mL/hr at 08/19/20 0700   PRN Meds: sodium chloride, sodium chloride, acetaminophen, haloperidol **OR** haloperidol lactate, morphine injection, nitroGLYCERIN, ondansetron (ZOFRAN) IV, sodium chloride flush, sodium chloride flush   Vital Signs    Vitals:   08/19/20 0400 08/19/20 0500 08/19/20 0600 08/19/20 0700  BP: 107/67 (!) 144/44 136/73 (!) 116/56  Pulse: 68 70  60  Resp: (!) 23 (!) 22 (!) 24 (!) 24  Temp: 98.3 F (36.8 C)     TempSrc: Oral     SpO2: 94% 91% 95% 95%  Weight:   101.6 kg   Height:        Intake/Output Summary (Last 24 hours) at 08/19/2020 0808 Last data filed at 08/19/2020 0700 Gross per 24 hour  Intake 1637.57 ml  Output 3175 ml  Net -1537.43 ml   Last 3 Weights 08/19/2020 08/16/2020 08/16/2008  Weight (lbs) 223 lb 15.8 oz 225 lb 215 lb  Weight (kg) 101.6 kg 102.059 kg 97.523 kg      Telemetry    Rhythm NSR. Rate 70s. -  Personally Reviewed  ECG    None today Personally Reviewed  Physical Exam   Appears comfortable, somnolent GEN: No acute distress.   Neck: No JVD Cardiac: RRR, no murmurs, rubs, or gallops.  Respiratory: Clear to auscultation bilaterally. GI: Soft, nontender, non-distended  MS: No edema; No deformity.  Healthy right radial access site Neuro:  Nonfocal  Psych: Normal affect   Labs    High Sensitivity Troponin:   Recent Labs  Lab 08/16/20 0718 08/16/20 1511  TROPONINIHS 2,351* >24,000*      Chemistry Recent Labs  Lab 08/16/20 0718 08/16/20 1511 08/18/20 0745 08/18/20 1938 08/19/20 0125  NA 137   < > 136 135 137  K 3.3*   < > 3.4* 3.6 3.3*  CL 99   < > 103 104 103  CO2 28   < > 25 22 25  GLUCOSE 121*   < > 107* 119* 102*  BUN 19   < > 19 20 21*  CREATININE 1.30*   < > 1.33* 1.31* 1.51*  CALCIUM 8.7*   < > 8.2* 8.3* 8.0*  PROT 7.9  --   --   --   --   ALBUMIN 4.1  --   --   --   --     AST 54*  --   --   --   --   ALT 25  --   --   --   --   ALKPHOS 98  --   --   --   --   BILITOT 1.2  --   --   --   --   GFRNONAA >60   < > >60 >60 53*  ANIONGAP 10   < > 8 9 9   < > = values in this interval not displayed.     Hematology Recent Labs  Lab 08/16/20 0718 08/17/20 0133 08/18/20 0745  WBC 15.3* 13.5* 12.7*  RBC 5.74 5.15 4.77  HGB 16.6 14.9 13.7  HCT 50.8 44.7 40.5  MCV 88.5 86.8 84.9  MCH 28.9 28.9 28.7  MCHC 32.7 33.3 33.8  RDW 14.5 14.3 13.9  PLT 283 251 211    BNP Recent Labs  Lab 08/18/20 1634  BNP 851.5*     DDimer No results for input(s): DDIMER in the last 168 hours.   Radiology    CT HEAD WO CONTRAST  Result Date: 08/18/2020 CLINICAL DATA:  Delirium.  Somnolence, confusion and agitation. EXAM: CT HEAD WITHOUT CONTRAST TECHNIQUE: Contiguous axial images were obtained from the base of the skull through the vertex without intravenous contrast. COMPARISON:  None. FINDINGS: Brain: Generalized cerebral atrophy. No intracranial hemorrhage,  mass effect, or midline shift. No hydrocephalus. The basilar cisterns are patent. 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 No evidence of territorial infarct or acute ischemia. No extra-axial or intracranial fluid collection. Vascular: No hyperdense vessel. Skull: No fracture or focal lesion. Sinuses/Orbits: Mucous retention cysts in the right maxillary sinus. Occasional mucosal thickening of ethmoid air cells. Mastoid air cells are clear. No acute orbital findings. Other: None. IMPRESSION: 1. No acute intracranial abnormality. 2. Generalized cerebral atrophy. 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 Electronically Signed   By: Melanie  Sanford M.D.   On: 08/18/2020 22:50   DG Chest Port 1 View  Result Date: 08/17/2020 CLINICAL DATA:  Dyspnea EXAM: PORTABLE CHEST 1 VIEW COMPARISON:  Portable exam 1602 hours compared to 08/16/2020 FINDINGS: Enlargement of cardiac silhouette. Mediastinal contours normal. Airspace infiltrates in the mid to upper lungs, increased, could represent pneumonia or pulmonary edema. No pleural effusion or pneumothorax. Osseous structures unremarkable. IMPRESSION: Increased infiltrates in the mid to upper lungs question multifocal pneumonia versus pulmonary edema. Electronically Signed   By: Mark  Boles M.D.   On: 08/17/2020 16:11   VAS US RENAL ARTERY DUPLEX  Result Date: 08/18/2020 ABDOMINAL VISCERAL Patient Name:  John Wilkerson  Date of Exam:   08/18/2020 Medical Rec #: [id number]       Accession #:    [id number] Date of Birth: 06/29/1960       Patient Gender: M Patient Age:   60Y Exam Location:  [facility] Procedure:      VAS US RENAL ARTERY DUPLEX Referring Phys: [id number] MIHAI CROITORU -------------------------------------------------------------------------------- Indications: Hypertension. Limitations: Air/bowel gas, obesity and patient discomfort. Comparison Study: No prior studies. Performing Technologist:  Rachel Hodge RDMS,RVT  Examination Guidelines: A complete evaluation includes B-mode imaging, spectral Doppler, color Doppler, and power Doppler as needed of all accessible portions of each vessel. Bilateral testing is considered an integral part of a complete examination. Limited examinations for reoccurring indications may be performed as noted.  Duplex Findings: +----------------------+--------+--------+------+------------------------------+ Mesenteric            PSV   cm/sEDV cm/sPlaque           Comments            +----------------------+--------+--------+------+------------------------------+ Aorta Prox               76                                                +----------------------+--------+--------+------+------------------------------+ Aorta Mid                56                     Difficult to visualize                                                   secondary to body habitus,                                                  movement, and bowel gas     +----------------------+--------+--------+------+------------------------------+ Celiac Artery Proximal  138                                                +----------------------+--------+--------+------+------------------------------+ SMA Origin              295                                                +----------------------+--------+--------+------+------------------------------+ SMA Proximal            347                                                +----------------------+--------+--------+------+------------------------------+    +------------------+--------+--------+-------+ Right Renal ArteryPSV cm/sEDV cm/sComment +------------------+--------+--------+-------+ Origin              168      38           +------------------+--------+--------+-------+ Proximal            162      47           +------------------+--------+--------+-------+ Mid                 129      30            +------------------+--------+--------+-------+ Distal              125      41           +------------------+--------+--------+-------+ +-----------------+--------+--------+-------+ Left Renal ArteryPSV cm/sEDV cm/sComment +-----------------+--------+--------+-------+ Origin              92      25           +-----------------+--------+--------+-------+   Proximal            98      22           +-----------------+--------+--------+-------+ Mid                107      25           +-----------------+--------+--------+-------+ Distal              95      37           +-----------------+--------+--------+-------+ +------------+--------+--------+----+-----------+--------+--------+----+ Right KidneyPSV cm/sEDV cm/sRI  Left KidneyPSV cm/sEDV cm/sRI   +------------+--------+--------+----+-----------+--------+--------+----+ Upper Pole  29      11      0.62Upper Pole 47      14      0.70 +------------+--------+--------+----+-----------+--------+--------+----+ Mid         36      14      0.61Mid        30      10      0.67 +------------+--------+--------+----+-----------+--------+--------+----+ Lower Pole  22      9       0.58Lower Pole 39      12      0.69 +------------+--------+--------+----+-----------+--------+--------+----+ Hilar       113     36      0.68Hilar      43      12      0.71 +------------+--------+--------+----+-----------+--------+--------+----+ +------------------+----------------------------------+------------------+-----+ Right Kidney                                        Left Kidney             +------------------+----------------------------------+------------------+-----+ RAR                                                 RAR                     +------------------+----------------------------------+------------------+-----+ RAR (manual)      2.21                              RAR (manual)      1.41   +------------------+----------------------------------+------------------+-----+ Cortex            Unable to obtain secondary to     Cortex            0.56                    patient movement                                          +------------------+----------------------------------+------------------+-----+ Cortex thickness                                    Corex thickness         +------------------+----------------------------------+------------------+-----+ Kidney length (cm)12.47                               Kidney length (cm)13.95 +------------------+----------------------------------+------------------+-----+  Summary: Renal:  Right: No evidence of right renal artery stenosis. Normal right        Resisitive Index. RRV flow present. 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 Left:  No evidence of left renal artery stenosis. Normal left        Resistive Index. LRV flow present. Mesenteric: Normal Celiac artery findings. 70 to 99% stenosis in the superior mesenteric artery.  *See table(s) above for measurements and observations.  Diagnosing physician: Thomas Hawken  Electronically signed by Thomas Hawken on 08/18/2020 at 2:53:10 PM.    Final     Cardiac Studies   Echocardiogram 08/16/2020  1. Left ventricular ejection fraction, by estimation, is 50 to 55%. The  left ventricle has low normal function. The left ventricle has no regional  wall motion abnormalities. There is severe left ventricular hypertrophy.  Left ventricular diastolic  parameters are indeterminate, but suggestive of restrictive diastolic  filling pattern.  2. Right ventricular systolic function is normal. The right ventricular  size is normal. Tricuspid regurgitation signal is inadequate for assessing  PA pressure.  3. The mitral valve is grossly normal. Mild mitral valve regurgitation.  4. The aortic valve is tricuspid. Aortic valve regurgitation is not   visualized.  5. The inferior vena cava is normal in size with greater than 50%  respiratory variability, suggesting right atrial pressure of 3 mmHg.   Cardiac catheterization 08/16/2020  Conclusions: 1. Multivessel coronary artery disease.  Culprit lesion for the patient's inferior STEMI is 95% thrombotic mid/distal RCA stenosis.  In addition, there is 70-80% mid LAD disease, 50% proximal LCx stenosis, and 60% distal RCA lesion extending to the bifurcation followed by moderate to severe RPDA and RPL disease. 2. Severely elevated left ventricular filling pressure (LVEDP 35 mmHg). 3. Successful PCI to mid/distal RCA using Resolute Onyx 3.5 x 18 mm drug-eluting stent (postdilated to 4.1 mm) with 0% residual stenosis and TIMI-3 flow. 4. Brachioradial artery precluding advancement of 6F guide catheter.  Consider using alternative access for future catheterizations.  Recommendations: 1. Dual antiplatelet therapy with aspirin and ticagrelor for at least 12 months. 2. Continue tirofiban infusion for 4 hours. 3. Obtain echocardiogram.  Aggressive secondary prevention, including high intensity statin therapy and smoking cessation. 4. Trend high-sensitivity troponin I until it has peaked, then stop. 5. Check hemoglobin A1c. 6. Anticipate staged PCI to LAD (could be performed this admission or shortly after discharge if patient is asymptomatic) based on hospital course.  Diagnostic Dominance: Right    Intervention     Implants    Permanent Stent   Stent Resolute Onyx 3.5x18 - [id number] - Implanted      Patient Profile     59 y.o. male smoker without recent medical care presenting with acute inferior ST segment elevation myocardial infarction found to also have severe LAD stenosis.  Underwent emergency PCI-DES of RCA with plan for staged LAD PCI.  Assessment & Plan    1. CAD s/p inf STEMI and PCI-DES RCA: Troponin > 24K. Ecg shows Q waves inferiorly. EF 50% by Echo. No recurrent  angina.  Plan for staged PCI of LAD stenosis this admission. Given acute agitation yesterday and CHF procedure postponed. Will tentatively plan tomorrow if stable.  He has diffuse residual disease in the distal RCA branches that we will treat medically.  Noted difficulty with catheter manipulation via the radial sheath, may do better with alternative access.  2.    Junctional rhythm: resolved.  3. HTN with hypertensive heart disease: Severe hypertension probably chronic.  Severe LVH on Echo. No evidence of RAS by duplex.   For now avoid RAAS inhibitors since he has CKD.  On  Amlodipine 10 mg daily,  carvedilol  12.5 mg bid. BP much improved today. 4.  Acute on CKD stage 3a. Creatinine 1.3 on admission. Now 1.5. likely due to chronic severe HTN. Some increase with recent contrast and diuresis. Will monitor. Avoid ACEi/ARB/Diuretics for now. Repeat BMET in am. No further diuretics today.  5. HLP: Baseline LDL 153, with low HDL, mildly elevated triglycerides, suggestive of a highly disadvantageous lipid profile consistent with metabolic syndrome and small dense LDL.  On high-dose atorvastatin. 6. Smoking: He is very motivated to quit smoking.  We will add a nicotine patch today.   7. Acute on chronic combined systolic/diastolic CHF. EDP 32 mm Hg at time of cath. Increased respiratory distress yesterday. Excellent diuresis with IV lasix x 2 yesterday pm. I/O negative 1.5 liters. Lungs clear today and able to lie flat. With increase in creatinine will hold further diuresis today. Consider addition of SGLT 2 prior to DC.  8. 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 Asymptomatic.   Will transfer to telemetry today. Ambulate. Plan PCI of LAD tomorrow.   For questions or updates, please contact [facility] Please consult [email or web address] for contact info under        Signed, Little Winton, MD  08/19/2020, 8:08 AM    

## 2020-08-19 NOTE — Progress Notes (Signed)
CARDIAC REHAB PHASE I   PRE:  Rate/Rhythm: 60 SR  BP:  Sitting: 109/65      SaO2: 94 3L  MODE:  Ambulation: 370 ft   POST:  Rate/Rhythm: 75 SR  BP:  Sitting: 112/77    SaO2: 97 3L  Pt ambulated 356ft in hallway assist of one with front wheel walker. Pt with one LOB after rising from chair, but steadier with use of walker. Pt denies CP, SOB, or dizziness throughout walk. Pt admits to confusion and hallucinations. Pt easily reoriented. Feels best he has felt since hospital admission. Re-cath postponed, pt and wife state understanding as to why. Will continue to follow.  [id number] Reynold Bowen, RN BSN 08/19/2020 10:26 AM

## 2020-08-19 NOTE — H&P (View-Only) (Signed)
Progress Note  Patient Name: John Wilkerson Date of Encounter: 08/19/2020  [facility] Cardiologist: None End  Subjective   No angina.  No dyspnea today. PCI of LAD cancelled yesterday due to increasing SOB and agitation. Somewhat sedated this am. Noted acute confusion, agitation last pm. Patient pulled out IVs and tried to leave. This lasted for < 30 minutes then according to nurse resolved. He did receive Haldol and CT head done.   Inpatient Medications    Scheduled Meds: . amLODipine  10 mg Oral Daily  . aspirin  81 mg Oral Daily  . atorvastatin  80 mg Oral Daily  . carvedilol  12.5 mg Oral BID WC  . Chlorhexidine Gluconate Cloth  6 each Topical Daily  . doxycycline  100 mg Oral Q12H  . nicotine  14 mg Transdermal Daily  . oxymetazoline  1 spray Each Nare BID  . sodium chloride flush  3 mL Intravenous Q12H  . sodium chloride flush  3 mL Intravenous Q12H  . ticagrelor  90 mg Oral BID   Continuous Infusions: . sodium chloride 1,000 mL (08/16/20 0725)  . sodium chloride    . sodium chloride    . sodium chloride 10 mL/hr at 08/19/20 0700   PRN Meds: sodium chloride, sodium chloride, acetaminophen, haloperidol **OR** haloperidol lactate, morphine injection, nitroGLYCERIN, ondansetron (ZOFRAN) IV, sodium chloride flush, sodium chloride flush   Vital Signs    Vitals:   08/19/20 0400 08/19/20 0500 08/19/20 0600 08/19/20 0700  BP: 107/67 (!) 144/44 136/73 (!) 116/56  Pulse: 68 70  60  Resp: (!) 23 (!) 22 (!) 24 (!) 24  Temp: 98.3 F (36.8 C)     TempSrc: Oral     SpO2: 94% 91% 95% 95%  Weight:   101.6 kg   Height:        Intake/Output Summary (Last 24 hours) at 08/19/2020 0808 Last data filed at 08/19/2020 0700 Gross per 24 hour  Intake 1637.57 ml  Output 3175 ml  Net -1537.43 ml   Last 3 Weights 08/19/2020 08/16/2020 08/16/2008  Weight (lbs) 223 lb 15.8 oz 225 lb 215 lb  Weight (kg) 101.6 kg 102.059 kg 97.523 kg      Telemetry    Rhythm NSR. Rate 70s. -  Personally Reviewed  ECG    None today Personally Reviewed  Physical Exam   Appears comfortable, somnolent GEN: No acute distress.   Neck: No JVD Cardiac: RRR, no murmurs, rubs, or gallops.  Respiratory: Clear to auscultation bilaterally. GI: Soft, nontender, non-distended  MS: No edema; No deformity.  Healthy right radial access site Neuro:  Nonfocal  Psych: Normal affect   Labs    High Sensitivity Troponin:   Recent Labs  Lab 08/16/20 0718 08/16/20 1511  TROPONINIHS 2,351* >24,000*      Chemistry Recent Labs  Lab 08/16/20 0718 08/16/20 1511 08/18/20 0745 08/18/20 1938 08/19/20 0125  NA 137   < > 136 135 137  K 3.3*   < > 3.4* 3.6 3.3*  CL 99   < > 103 104 103  CO2 28   < > GLUCOSE 121*   < > 107* 119* 102*  BUN 19   < > 19 20 21*  CREATININE 1.30*   < > 1.33* 1.31* 1.51*  CALCIUM 8.7*   < > 8.2* 8.3* 8.0*  PROT 7.9  --   --   --   --   ALBUMIN 4.1  --   --   --   --  AST 54*  --   --   --   --   ALT 25  --   --   --   --   ALKPHOS 98  --   --   --   --   BILITOT 1.2  --   --   --   --   GFRNONAA >60   < > >60 >60 53*  ANIONGAP 10   < > 8 9 9    < > = values in this interval not displayed.     Hematology Recent Labs  Lab 08/16/20 0718 08/17/20 0133 08/18/20 0745  WBC 15.3* 13.5* 12.7*  RBC 5.74 5.15 4.77  HGB 16.6 14.9 13.7  HCT 50.8 44.7 40.5  MCV 88.5 86.8 84.9  MCH 28.9 28.9 28.7  MCHC 32.7 33.3 33.8  RDW 14.5 14.3 13.9  PLT 283 251 211    BNP Recent Labs  Lab 08/18/20 1634  BNP 851.5*     DDimer No results for input(s): DDIMER in the last 168 hours.   Radiology    CT HEAD WO CONTRAST  Result Date: 08/18/2020 CLINICAL DATA:  Delirium.  Somnolence, confusion and agitation. EXAM: CT HEAD WITHOUT CONTRAST TECHNIQUE: Contiguous axial images were obtained from the base of the skull through the vertex without intravenous contrast. COMPARISON:  None. FINDINGS: Brain: Generalized cerebral atrophy. No intracranial hemorrhage,  mass effect, or midline shift. No hydrocephalus. The basilar cisterns are patent. 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 No evidence of territorial infarct or acute ischemia. No extra-axial or intracranial fluid collection. Vascular: No hyperdense vessel. Skull: No fracture or focal lesion. Sinuses/Orbits: Mucous retention cysts in the right maxillary sinus. Occasional mucosal thickening of ethmoid air cells. Mastoid air cells are clear. No acute orbital findings. Other: None. IMPRESSION: 1. No acute intracranial abnormality. 2. Generalized cerebral atrophy. 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 Electronically Signed   By: 10/18/2020 M.D.   On: 08/18/2020 22:50   DG Chest Port 1 View  Result Date: 08/17/2020 CLINICAL DATA:  Dyspnea EXAM: PORTABLE CHEST 1 VIEW COMPARISON:  Portable exam 1602 hours compared to 08/16/2020 FINDINGS: Enlargement of cardiac silhouette. Mediastinal contours normal. Airspace infiltrates in the mid to upper lungs, increased, could represent pneumonia or pulmonary edema. No pleural effusion or pneumothorax. Osseous structures unremarkable. IMPRESSION: Increased infiltrates in the mid to upper lungs question multifocal pneumonia versus pulmonary edema. Electronically Signed   By: 10/16/2020 M.D.   On: 08/17/2020 16:11   VAS 10/17/2020 RENAL ARTERY DUPLEX  Result Date: 08/18/2020 ABDOMINAL VISCERAL Patient Name:  John Wilkerson  Date of Exam:   08/18/2020 Medical Rec #: 10/18/2020       Accession #:    [id number] Date of Birth: [birth year]       Patient Gender: M Patient Age:   59Y Exam Location:  [facility] Procedure:      VAS [facility] RENAL ARTERY DUPLEX Referring Phys: [place] [facility] CROITORU -------------------------------------------------------------------------------- Indications: Hypertension. Limitations: Air/bowel gas, obesity and patient discomfort. Comparison Study: No prior studies. Performing Technologist:  [facility] RDMS,RVT  Examination Guidelines: A complete evaluation includes B-mode imaging, spectral Doppler, color Doppler, and power Doppler as needed of all accessible portions of each vessel. Bilateral testing is considered an integral part of a complete examination. Limited examinations for reoccurring indications may be performed as noted.  Duplex Findings: +----------------------+--------+--------+------+------------------------------+ Mesenteric            PSV  cm/sEDV cm/sPlaque           Comments            +----------------------+--------+--------+------+------------------------------+ Aorta Prox               76                                                +----------------------+--------+--------+------+------------------------------+ Aorta Mid                56                     Difficult to visualize                                                   secondary to body habitus,                                                  movement, and bowel gas     +----------------------+--------+--------+------+------------------------------+ Celiac Artery Proximal  138                                                +----------------------+--------+--------+------+------------------------------+ SMA Origin              295                                                +----------------------+--------+--------+------+------------------------------+ SMA Proximal            347                                                +----------------------+--------+--------+------+------------------------------+    +------------------+--------+--------+-------+ Right Renal ArteryPSV cm/sEDV cm/sComment +------------------+--------+--------+-------+ Origin              168      38           +------------------+--------+--------+-------+ Proximal            162      47           +------------------+--------+--------+-------+ Mid                 129      30            +------------------+--------+--------+-------+ Distal              125      41           +------------------+--------+--------+-------+ +-----------------+--------+--------+-------+ Left Renal ArteryPSV cm/sEDV cm/sComment +-----------------+--------+--------+-------+ Origin              92      25           +-----------------+--------+--------+-------+  Proximal            98      22           +-----------------+--------+--------+-------+ Mid                107      25           +-----------------+--------+--------+-------+ Distal              95      37           +-----------------+--------+--------+-------+ +------------+--------+--------+----+-----------+--------+--------+----+ Right KidneyPSV cm/sEDV cm/sRI  Left KidneyPSV cm/sEDV cm/sRI   +------------+--------+--------+----+-----------+--------+--------+----+ Upper Pole  29      11      0.62Upper Pole 47      14      0.70 +------------+--------+--------+----+-----------+--------+--------+----+ Mid         36      14      0.        30      10      0.67 +------------+--------+--------+----+-----------+--------+--------+----+ Lower Pole  22      9       0.58Lower Pole 39      12      0.69 +------------+--------+--------+----+-----------+--------+--------+----+ Hilar       113     36      0.68Hilar      43      12      0.71 +------------+--------+--------+----+-----------+--------+--------+----+ +------------------+----------------------------------+------------------+-----+ Right Kidney                                        Left Kidney             +------------------+----------------------------------+------------------+-----+ RAR                                                 RAR                     +------------------+----------------------------------+------------------+-----+ RAR (manual)      2.21                              RAR (manual)      1.41   +------------------+----------------------------------+------------------+-----+ Cortex            Unable to obtain secondary to     Cortex            0.56                    patient movement                                          +------------------+----------------------------------+------------------+-----+ Cortex thickness                                    Corex thickness         +------------------+----------------------------------+------------------+-----+ Kidney length (cm)12.47  Kidney length (cm)13.95 +------------------+----------------------------------+------------------+-----+  Summary: Renal:  Right: No evidence of right renal artery stenosis. Normal right        Resisitive Index. RRV flow present. 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 Left:  No evidence of left renal artery stenosis. Normal left        Resistive Index. LRV flow present. Mesenteric: Normal Celiac artery findings. 70 to 99% stenosis in the superior mesenteric artery.  *See table(s) above for measurements and observations.  Diagnosing physician: Heath Lark  Electronically signed by Heath Lark on 08/18/2020 at 2:53:10 PM.    Final     Cardiac Studies   Echocardiogram 08/16/2020  1. Left ventricular ejection fraction, by estimation, is 50 to 55%. The  left ventricle has low normal function. The left ventricle has no regional  wall motion abnormalities. There is severe left ventricular hypertrophy.  Left ventricular diastolic  parameters are indeterminate, but suggestive of restrictive diastolic  filling pattern.  2. Right ventricular systolic function is normal. The right ventricular  size is normal. Tricuspid regurgitation signal is inadequate for assessing  PA pressure.  3. The mitral valve is grossly normal. Mild mitral valve regurgitation.  4. The aortic valve is tricuspid. Aortic valve regurgitation is not   visualized.  5. The inferior vena cava is normal in size with greater than 50%  respiratory variability, suggesting right atrial pressure of 3 mmHg.   Cardiac catheterization 08/16/2020  Conclusions: 1. Multivessel coronary artery disease.  Culprit lesion for the patient's inferior STEMI is 95% thrombotic mid/distal RCA stenosis.  In addition, there is 70-80% mid LAD disease, 50% proximal LCx stenosis, and 60% distal RCA lesion extending to the bifurcation followed by moderate to severe RPDA and RPL disease. 2. Severely elevated left ventricular filling pressure (LVEDP 35 mmHg). 3. Successful PCI to mid/distal RCA using Resolute Onyx 3.5 x 18 mm drug-eluting stent (postdilated to 4.1 mm) with 0% residual stenosis and TIMI-3 flow. 4. Brachioradial artery precluding advancement of 24F guide catheter.  Consider using alternative access for future catheterizations.  Recommendations: 1. Dual antiplatelet therapy with aspirin and ticagrelor for at least 12 months. 2. Continue tirofiban infusion for 4 hours. 3. Obtain echocardiogram.  Aggressive secondary prevention, including high intensity statin therapy and smoking cessation. 4. Trend high-sensitivity troponin I until it has peaked, then stop. 5. Check hemoglobin A1c. 6. Anticipate staged PCI to LAD (could be performed this admission or shortly after discharge if patient is asymptomatic) based on hospital course.  Diagnostic Dominance: Right    Intervention     Implants    Permanent Stent   Stent Resolute Onyx 3.5x18 - [id number] - Implanted      Patient Profile     60 y.o. male smoker without recent medical care presenting with acute inferior ST segment elevation myocardial infarction found to also have severe LAD stenosis.  Underwent emergency PCI-DES of RCA with plan for staged LAD PCI.  Assessment & Plan    1. CAD s/p inf STEMI and PCI-DES RCA: Troponin > 24K. Ecg shows Q waves inferiorly. EF 50% by Echo. No recurrent  angina.  Plan for staged PCI of LAD stenosis this admission. Given acute agitation yesterday and CHF procedure postponed. Will tentatively plan tomorrow if stable.  He has diffuse residual disease in the distal RCA branches that we will treat medically.  Noted difficulty with catheter manipulation via the radial sheath, may do better with alternative access.  2.  Junctional rhythm: resolved.  3. HTN with hypertensive heart disease: Severe hypertension probably chronic.  Severe LVH on Echo. No evidence of RAS by duplex.   For now avoid RAAS inhibitors since he has CKD.  On  Amlodipine 10 mg daily,  carvedilol  12.5 mg bid. BP much improved today. 4.  Acute on CKD stage 3a. Creatinine 1.3 on admission. Now 1.5. likely due to chronic severe HTN. Some increase with recent contrast and diuresis. Will monitor. Avoid ACEi/ARB/Diuretics for now. Repeat BMET in am. No further diuretics today.  5. HLP: Baseline LDL 153, with low HDL, mildly elevated triglycerides, suggestive of a highly disadvantageous lipid profile consistent with metabolic syndrome and small dense LDL.  On high-dose atorvastatin. 6. Smoking: He is very motivated to quit smoking.  We will add a nicotine patch today.   7. Acute on chronic combined systolic/diastolic CHF. EDP 32 mm Hg at time of cath. Increased respiratory distress yesterday. Excellent diuresis with IV lasix x 2 yesterday pm. I/O negative 1.5 liters. Lungs clear today and able to lie flat. With increase in creatinine will hold further diuresis today. Consider addition of SGLT 2 prior to DC.  8. 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 Asymptomatic.   Will transfer to telemetry today. Ambulate. Plan PCI of LAD tomorrow.   For questions or updates, please contact [facility] Please consult [email or web address] for contact info under        Signed, Zarina Pe [place], MD  08/19/2020, 8:08 AM

## 2020-08-19 NOTE — Social Work (Signed)
CSW was informed by Davina Poke that patient does not have PCP. CSW spoke with patient at bedside and provided PCP resources to patient. Patient accepted. CSW will continue to follow.

## 2020-08-20 ENCOUNTER — Inpatient Hospital Stay (HOSPITAL_COMMUNITY)
Admission: EM | Disposition: A | Discharge status: Home / Self Care | Payer: Self-pay | Source: Home / Self Care | Attending: Internal Medicine

## 2020-08-20 DIAGNOSIS — I25110 Atherosclerotic heart disease of native coronary artery with unstable angina pectoris: Secondary | ICD-10-CM

## 2020-08-20 DIAGNOSIS — I200 Unstable angina: Secondary | ICD-10-CM | POA: Diagnosis present

## 2020-08-20 HISTORY — PX: CORONARY STENT INTERVENTION: CATH118234

## 2020-08-20 HISTORY — PX: INTRAVASCULAR ULTRASOUND/IVUS: CATH118244

## 2020-08-20 LAB — POCT ACTIVATED CLOTTING TIME
Activated Clotting Time: 381 seconds
Activated Clotting Time: 208 seconds
Activated Clotting Time: 173 seconds

## 2020-08-20 LAB — BASIC METABOLIC PANEL
Anion gap: 9 (ref 5–15)
BUN: 31 mg/dL — ABNORMAL HIGH (ref 6–20)
CO2: 22 mmol/L (ref 22–32)
Calcium: 8.3 mg/dL — ABNORMAL LOW (ref 8.9–10.3)
Chloride: 105 mmol/L (ref 98–111)
Creatinine, Ser: 1.62 mg/dL — ABNORMAL HIGH (ref 0.61–1.24)
GFR, Estimated: 48 mL/min — ABNORMAL LOW (ref >60)
Glucose, Bld: 108 mg/dL — ABNORMAL HIGH (ref 70–99)
Potassium: 3.6 mmol/L (ref 3.5–5.1)
Sodium: 136 mmol/L (ref 135–145)

## 2020-08-20 SURGERY — CORONARY STENT INTERVENTION
Anesthesia: LOCAL

## 2020-08-20 MED ORDER — SODIUM CHLORIDE 0.9 % IV SOLN: INTRAVENOUS | Status: DC

## 2020-08-20 MED ORDER — LABETALOL HCL 5 MG/ML IV SOLN: 10.0000 mg | INTRAVENOUS | Status: AC | PRN

## 2020-08-20 MED ORDER — SODIUM CHLORIDE 0.9% FLUSH: 3.0000 mL | INTRAVENOUS | Status: DC | PRN

## 2020-08-20 MED ORDER — MIDAZOLAM HCL 2 MG/2ML IJ SOLN
INTRAMUSCULAR | Status: AC
  Filled 2020-08-20: qty 2

## 2020-08-20 MED ORDER — SODIUM CHLORIDE 0.9% FLUSH
3.0000 mL | Freq: Two times a day (BID) | INTRAVENOUS | Status: DC
  Administered 2020-08-20: 3 mL via INTRAVENOUS

## 2020-08-20 MED ORDER — IOHEXOL 350 MG/ML SOLN
INTRAVENOUS | Status: DC | PRN
  Administered 2020-08-20: 75 mL

## 2020-08-20 MED ORDER — HEPARIN SODIUM (PORCINE) 1000 UNIT/ML IJ SOLN
INTRAMUSCULAR | Status: DC | PRN
  Administered 2020-08-20: 12000 [IU] via INTRAVENOUS

## 2020-08-20 MED ORDER — HYDRALAZINE HCL 20 MG/ML IJ SOLN: 10.0000 mg | INTRAMUSCULAR | Status: AC | PRN

## 2020-08-20 MED ORDER — HEPARIN SODIUM (PORCINE) 1000 UNIT/ML IJ SOLN
INTRAMUSCULAR | Status: AC
  Filled 2020-08-20: qty 1

## 2020-08-20 MED ORDER — TICAGRELOR 90 MG PO TABS
90.0000 mg | ORAL_TABLET | Freq: Two times a day (BID) | ORAL | Status: DC
  Administered 2020-08-20 – 2020-08-21 (×2): 90 mg via ORAL
  Filled 2020-08-20 (×2): qty 1

## 2020-08-20 MED ORDER — HEPARIN (PORCINE) IN NACL 1000-0.9 UT/500ML-% IV SOLN
INTRAVENOUS | Status: DC | PRN
  Administered 2020-08-20 (×2): 500 mL

## 2020-08-20 MED ORDER — FENTANYL CITRATE (PF) 100 MCG/2ML IJ SOLN
INTRAMUSCULAR | Status: AC
  Filled 2020-08-20: qty 2

## 2020-08-20 MED ORDER — MIDAZOLAM HCL 2 MG/2ML IJ SOLN
INTRAMUSCULAR | Status: DC | PRN
  Administered 2020-08-20: 2 mg via INTRAVENOUS

## 2020-08-20 MED ORDER — LIDOCAINE HCL (PF) 1 % IJ SOLN
INTRAMUSCULAR | Status: DC | PRN
  Administered 2020-08-20: 20 mL

## 2020-08-20 MED ORDER — HEPARIN (PORCINE) IN NACL 1000-0.9 UT/500ML-% IV SOLN
INTRAVENOUS | Status: AC
  Filled 2020-08-20: qty 1000

## 2020-08-20 MED ORDER — SODIUM CHLORIDE 0.9 % IV SOLN: 250.0000 mL | INTRAVENOUS | Status: DC | PRN

## 2020-08-20 MED ORDER — SODIUM CHLORIDE 0.9 % IV SOLN: INTRAVENOUS | Status: AC

## 2020-08-20 MED ORDER — LIDOCAINE HCL (PF) 1 % IJ SOLN
INTRAMUSCULAR | Status: AC
  Filled 2020-08-20: qty 30

## 2020-08-20 MED ORDER — HEPARIN (PORCINE) IN NACL 2000-0.9 UNIT/L-% IV SOLN
INTRAVENOUS | Status: AC
  Filled 2020-08-20: qty 1000

## 2020-08-20 MED ORDER — VERAPAMIL HCL 2.5 MG/ML IV SOLN
INTRAVENOUS | Status: AC
  Filled 2020-08-20: qty 2

## 2020-08-20 MED ORDER — FENTANYL CITRATE (PF) 100 MCG/2ML IJ SOLN
INTRAMUSCULAR | Status: DC | PRN
  Administered 2020-08-20: 50 ug via INTRAVENOUS

## 2020-08-20 SURGICAL SUPPLY — 17 items
BALLN SAPPHIRE 2.0X15 (BALLOONS) ×2
BALLN SAPPHIRE ~~LOC~~ 3.75X15 (BALLOONS) ×2 IMPLANT
BALLOON SAPPHIRE 2.0X15 (BALLOONS) ×1 IMPLANT
CATH OPTICROSS HD (CATHETERS) ×2 IMPLANT
CATH VISTA GUIDE 6FR XBLAD3.5 (CATHETERS) ×2 IMPLANT
KIT ENCORE 26 ADVANTAGE (KITS) ×2 IMPLANT
KIT HEART LEFT (KITS) ×2 IMPLANT
PACK CARDIAC CATHETERIZATION (CUSTOM PROCEDURE TRAY) ×2 IMPLANT
SET INTRODUCER MICROPUNCT 5F (INTRODUCER) ×2 IMPLANT
SHEATH PINNACLE 6F 10CM (SHEATH) ×2 IMPLANT
SHEATH PROBE COVER 6X72 (BAG) ×2 IMPLANT
SLED PULL BACK IVUS (MISCELLANEOUS) ×2 IMPLANT
STENT RESOLUTE ONYX 3.5X22 (Permanent Stent) ×2 IMPLANT
TRANSDUCER W/STOPCOCK (MISCELLANEOUS) ×2 IMPLANT
TUBING CIL FLEX 10 FLL-RA (TUBING) ×2 IMPLANT
WIRE COUGAR XT STRL 190CM (WIRE) ×4 IMPLANT
WIRE EMERALD 3MM-J .035X150CM (WIRE) ×2 IMPLANT

## 2020-08-20 NOTE — Interval H&P Note (Signed)
History and Physical Interval Note:  08/20/2020 10:03 AM  John Wilkerson  has presented today for surgery, with the diagnosis of CAD.  The various methods of treatment have been discussed with the patient and family. After consideration of risks, benefits and other options for treatment, the patient has consented to  Procedure(s): CORONARY STENT INTERVENTION (N/A) as a surgical intervention.  The patient's history has been reviewed, patient examined, no change in status, stable for surgery.  I have reviewed the patient's chart and labs.  Questions were answered to the patient's satisfaction.    Cath Lab Visit (complete for each Cath Lab visit)  Clinical Evaluation Leading to the Procedure:   ACS: Yes.    Non-ACS:    Anginal Classification: CCS III  Anti-ischemic medical therapy: No Therapy  Non-Invasive Test Results: No non-invasive testing performed  Prior CABG: No previous CABG        Verne Carrow

## 2020-08-20 NOTE — Progress Notes (Signed)
Progress Note  Patient Name: John Wilkerson Date of Encounter: 08/20/2020  [facility] Cardiologist: None End  Subjective   No angina.  No dyspnea today. Feels well. No further confusion.  Inpatient Medications    Scheduled Meds: . [MAR Hold] amLODipine  10 mg Oral Daily  . [MAR Hold] aspirin  81 mg Oral Daily  . [MAR Hold] atorvastatin  80 mg Oral Daily  . [MAR Hold] carvedilol  12.5 mg Oral BID WC  . [MAR Hold] Chlorhexidine Gluconate Cloth  6 each Topical Daily  . [MAR Hold] doxycycline  100 mg Oral Q12H  . [MAR Hold] nicotine  14 mg Transdermal Daily  . [MAR Hold] oxymetazoline  1 spray Each Nare BID  . [MAR Hold] sodium chloride flush  3 mL Intravenous Q12H  . [MAR Hold] sodium chloride flush  3 mL Intravenous Q12H  . [MAR Hold] ticagrelor  90 mg Oral BID   Continuous Infusions: . sodium chloride 1,000 mL (08/16/20 0725)  . [MAR Hold] sodium chloride    . sodium chloride 10 mL/hr at 08/20/20 0950  . sodium chloride 75 mL/hr at 08/20/20 1130   PRN Meds: [MAR Hold] sodium chloride, [MAR Hold] acetaminophen, [MAR Hold] haloperidol **OR** [MAR Hold] haloperidol lactate, [MAR Hold]  morphine injection, [MAR Hold] nitroGLYCERIN, [MAR Hold] ondansetron (ZOFRAN) IV, [MAR Hold] sodium chloride flush   Vital Signs    Vitals:   08/20/20 1102 08/20/20 1127 08/20/20 1150 08/20/20 1245  BP: 110/74 (!) 108/59 (!) 113/55 108/67  Pulse: (!) 54 (!) 57 (!) 56 (!) 52  Resp: 19 18 11 14   Temp:      TempSrc:      SpO2: 95% 90% 93% 91%  Weight:      Height:        Intake/Output Summary (Last 24 hours) at 08/20/2020 1328 Last data filed at 08/20/2020 0800 Gross per 24 hour  Intake 372.61 ml  Output 760 ml  Net -387.39 ml   Last 3 Weights 08/19/2020 08/16/2020 08/16/2008  Weight (lbs) 223 lb 15.8 oz 225 lb 215 lb  Weight (kg) 101.6 kg 102.059 kg 97.523 kg      Telemetry    Rhythm NSR. Rate 70s. - Personally Reviewed  ECG    None today Personally Reviewed  Physical Exam     GEN: No acute distress.   Neck: No JVD Cardiac: RRR, no murmurs, rubs, or gallops.  Respiratory: Clear to auscultation bilaterally. GI: Soft, nontender, non-distended  MS: No edema; No deformity.  Healthy right radial access site Neuro:  Nonfocal  Psych: Normal affect   Labs    High Sensitivity Troponin:   Recent Labs  Lab 08/16/20 0718 08/16/20 1511  TROPONINIHS 2,351* >24,000*      Chemistry Recent Labs  Lab 08/16/20 0718 08/16/20 1511 08/18/20 1938 08/19/20 0125 08/20/20 0108  NA 137   < > 135 137 136  K 3.3*   < > 3.6 3.3* 3.6  CL 99   < > 104 103 105  CO2 28   < > 22 25 22   GLUCOSE 121*   < > 119* 102* 108*  BUN 19   < > 20 21* 31*  CREATININE 1.30*   < > 1.31* 1.51* 1.62*  CALCIUM 8.7*   < > 8.3* 8.0* 8.3*  PROT 7.9  --   --   --   --   ALBUMIN 4.1  --   --   --   --   AST 54*  --   --   --   --  ALT 25  --   --   --   --   ALKPHOS 98  --   --   --   --   BILITOT 1.2  --   --   --   --   GFRNONAA >60   < > >60 53* 48*  ANIONGAP 10   < > 9 9 9    < > = values in this interval not displayed.     Hematology Recent Labs  Lab 08/16/20 0718 08/17/20 0133 08/18/20 0745  WBC 15.3* 13.5* 12.7*  RBC 5.74 5.15 4.77  HGB 16.6 14.9 13.7  HCT 50.8 44.7 40.5  MCV 88.5 86.8 84.9  MCH 28.9 28.9 28.7  MCHC 32.7 33.3 33.8  RDW 14.5 14.3 13.9  PLT 283 251 211    BNP Recent Labs  Lab 08/18/20 1634  BNP 851.5*     DDimer No results for input(s): DDIMER in the last 168 hours.   Radiology    CT HEAD WO CONTRAST  Result Date: 08/18/2020 CLINICAL DATA:  Delirium.  Somnolence, confusion and agitation. EXAM: CT HEAD WITHOUT CONTRAST TECHNIQUE: Contiguous axial images were obtained from the base of the skull through the vertex without intravenous contrast. COMPARISON:  None. FINDINGS: Brain: Generalized cerebral atrophy. No intracranial hemorrhage, mass effect, or midline shift. No hydrocephalus. The basilar cisterns are patent. Remote lacunar infarcts in the  right caudate and bilateral basal ganglia. No evidence of territorial infarct or acute ischemia. No extra-axial or intracranial fluid collection. Vascular: No hyperdense vessel. Skull: No fracture or focal lesion. Sinuses/Orbits: Mucous retention cysts in the right maxillary sinus. Occasional mucosal thickening of ethmoid air cells. Mastoid air cells are clear. No acute orbital findings. Other: None. IMPRESSION: 1. No acute intracranial abnormality. 2. Generalized cerebral atrophy. 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 Electronically Signed   By: 10/18/2020 M.D.   On: 08/18/2020 22:50   CARDIAC CATHETERIZATION  Result Date: 08/20/2020  Mid LAD-1 lesion is 75% stenosed.  A drug-eluting stent was successfully placed using a STENT RESOLUTE ONYX 3.5X22.  Post intervention, there is a 0% residual stenosis.  1. Severe mid LAD stenosis 2. Successful PTCA/DES x 1 mid LAD Recommendations: Continue DAPT with ASA/Brilinta for 12 months.    Cardiac Studies   Echocardiogram 08/16/2020  1. Left ventricular ejection fraction, by estimation, is 50 to 55%. The  left ventricle has low normal function. The left ventricle has no regional  wall motion abnormalities. There is severe left ventricular hypertrophy.  Left ventricular diastolic  parameters are indeterminate, but suggestive of restrictive diastolic  filling pattern.  2. Right ventricular systolic function is normal. The right ventricular  size is normal. Tricuspid regurgitation signal is inadequate for assessing  PA pressure.  3. The mitral valve is grossly normal. Mild mitral valve regurgitation.  4. The aortic valve is tricuspid. Aortic valve regurgitation is not  visualized.  5. The inferior vena cava is normal in size with greater than 50%  respiratory variability, suggesting right atrial pressure of 3 mmHg.   Cardiac catheterization 08/16/2020  Conclusions: 1. Multivessel coronary artery  disease.  Culprit lesion for the patient's inferior STEMI is 95% thrombotic mid/distal RCA stenosis.  In addition, there is 70-80% mid LAD disease, 50% proximal LCx stenosis, and 60% distal RCA lesion extending to the bifurcation followed by moderate to severe RPDA and RPL disease. 2. Severely elevated left ventricular filling pressure (LVEDP 35 mmHg). 3. Successful PCI to mid/distal RCA using Resolute  Onyx 3.5 x 18 mm drug-eluting stent (postdilated to 4.1 mm) with 0% residual stenosis and TIMI-3 flow. 4. Brachioradial artery precluding advancement of 71F guide catheter.  Consider using alternative access for future catheterizations.  Recommendations: 1. Dual antiplatelet therapy with aspirin and ticagrelor for at least 12 months. 2. Continue tirofiban infusion for 4 hours. 3. Obtain echocardiogram.  Aggressive secondary prevention, including high intensity statin therapy and smoking cessation. 4. Trend high-sensitivity troponin I until it has peaked, then stop. 5. Check hemoglobin A1c. 6. Anticipate staged PCI to LAD (could be performed this admission or shortly after discharge if patient is asymptomatic) based on hospital course.  Diagnostic Dominance: Right    Intervention     Implants    Permanent Stent   Stent Resolute Onyx 3.5x18 - [id number] - Implanted      Patient Profile     60 y.o. male smoker without recent medical care presenting with acute inferior ST segment elevation myocardial infarction found to also have severe LAD stenosis.  Underwent emergency PCI-DES of RCA with plan for staged LAD PCI.  Assessment & Plan    1. CAD s/p inf STEMI and PCI-DES RCA: Troponin > 24K. Ecg shows Q waves inferiorly. EF 50% by Echo. No recurrent angina.  Had successful staged PCI of LAD stenosis today.  He has diffuse residual disease in the distal RCA branches that we will treat medically.   2.  Junctional rhythm: resolved.  3. HTN with hypertensive heart disease: Severe  hypertension probably chronic.  Severe LVH on Echo. No evidence of RAS by duplex.   For now avoid RAAS inhibitors since he has CKD.  On  Amlodipine 10 mg daily,  carvedilol  12.5 mg bid. BP stable 4.  Acute on CKD stage 3a. Creatinine 1.3 on admission. Increased to 1.5>>1.6.  likely due to chronic severe HTN. Some increase with recent contrast and diuresis. Appears to be leveling off.  Avoid ACEi/ARB/Diuretics for now. Repeat BMET in am. No further diuretics today.  5. HLP: Baseline LDL 153, with low HDL, mildly elevated triglycerides, suggestive of a highly disadvantageous lipid profile consistent with metabolic syndrome and small dense LDL.  On high-dose atorvastatin. 6. Smoking: He is very motivated to quit smoking.  We will add a nicotine patch  7. Acute on chronic combined systolic/diastolic CHF. EDP 32 mm Hg at time of cath. Increased respiratory distress yesterday. Excellent diuresis with IV lasix x 2 . I/O negative 1.5 liters. Lungs clear today and able to lie flat. With increase in creatinine will hold further diuresis. Consider addition of SGLT 2 prior to DC.  8. 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 Asymptomatic.   Transfer to telemetry today. If stable anticipate DC tomorrow. Lives in [place] so likely will follow up in our [place] office.  For questions or updates, please contact [facility] Please consult [email or web address] for contact info under        Signed, Janyce Ellinger [place], MD  08/20/2020, 1:28 PM

## 2020-08-20 NOTE — Progress Notes (Signed)
SITE AREA: right groin/femoral  SITE PRIOR TO REMOVAL:  LEVEL 0  PRESSURE APPLIED FOR: approximately 20 minutes  MANUAL: yes  PATIENT STATUS DURING PULL: stable  POST PULL SITE:  LEVEL 0  POST PULL INSTRUCTIONS GIVEN: yes  POST PULL PULSES PRESENT: bilateral pedal pulses +2  DRESSING APPLIED: gauze with tegaderm  BEDREST BEGINS @ 1443  COMMENTS:

## 2020-08-21 ENCOUNTER — Other Ambulatory Visit (HOSPITAL_COMMUNITY): Payer: Self-pay

## 2020-08-21 ENCOUNTER — Telehealth: Payer: Self-pay | Admitting: *Deleted

## 2020-08-21 ENCOUNTER — Encounter (HOSPITAL_COMMUNITY): Payer: Self-pay | Admitting: Cardiovascular Disease

## 2020-08-21 LAB — BASIC METABOLIC PANEL
Anion gap: 9 (ref 5–15)
BUN: 22 mg/dL — ABNORMAL HIGH (ref 6–20)
CO2: 20 mmol/L — ABNORMAL LOW (ref 22–32)
Calcium: 8.6 mg/dL — ABNORMAL LOW (ref 8.9–10.3)
Chloride: 108 mmol/L (ref 98–111)
Creatinine, Ser: 1.27 mg/dL — ABNORMAL HIGH (ref 0.61–1.24)
GFR, Estimated: >60 mL/min (ref >60)
Glucose, Bld: 101 mg/dL — ABNORMAL HIGH (ref 70–99)
Potassium: 3.4 mmol/L — ABNORMAL LOW (ref 3.5–5.1)
Sodium: 137 mmol/L (ref 135–145)

## 2020-08-21 LAB — CBC
HCT: 42.3 % (ref 39.0–52.0)
Hemoglobin: 14.1 g/dL (ref 13.0–17.0)
MCH: 28.5 pg (ref 26.0–34.0)
MCHC: 33.3 g/dL (ref 30.0–36.0)
MCV: 85.6 fL (ref 80.0–100.0)
Platelets: 265 10*3/uL (ref 150–400)
RBC: 4.94 MIL/uL (ref 4.22–5.81)
RDW: 14.3 % (ref 11.5–15.5)
WBC: 8.6 10*3/uL (ref 4.0–10.5)
nRBC: 0 % (ref 0.0–0.2)

## 2020-08-21 MED ORDER — ATORVASTATIN CALCIUM 80 MG PO TABS
80.0000 mg | ORAL_TABLET | Freq: Every day | ORAL | 11 refills | Status: DC
  Filled 2020-08-21: qty 30, 30d supply, fill #0

## 2020-08-21 MED ORDER — CARVEDILOL 12.5 MG PO TABS
12.5000 mg | ORAL_TABLET | Freq: Two times a day (BID) | ORAL | 11 refills | Status: DC
  Filled 2020-08-21: qty 60, 30d supply, fill #0

## 2020-08-21 MED ORDER — AMLODIPINE BESYLATE 10 MG PO TABS
10.0000 mg | ORAL_TABLET | Freq: Every day | ORAL | 11 refills | Status: DC
  Filled 2020-08-21: qty 30, 30d supply, fill #0

## 2020-08-21 MED ORDER — TICAGRELOR 90 MG PO TABS
90.0000 mg | ORAL_TABLET | Freq: Two times a day (BID) | ORAL | 11 refills | Status: DC
  Filled 2020-08-21: qty 60, 30d supply, fill #0

## 2020-08-21 MED ORDER — NICOTINE 14 MG/24HR TD PT24
14.0000 mg | MEDICATED_PATCH | Freq: Every day | TRANSDERMAL | 0 refills | Status: DC
  Filled 2020-08-21: qty 28, 28d supply, fill #0

## 2020-08-21 MED ORDER — POTASSIUM CHLORIDE CRYS ER 20 MEQ PO TBCR
40.0000 meq | EXTENDED_RELEASE_TABLET | Freq: Once | ORAL | Status: AC
  Administered 2020-08-21: 40 meq via ORAL
  Filled 2020-08-21: qty 4

## 2020-08-21 MED ORDER — EMPAGLIFLOZIN 10 MG PO TABS
10.0000 mg | ORAL_TABLET | Freq: Every day | ORAL | Status: DC
  Administered 2020-08-21: 10 mg via ORAL
  Filled 2020-08-21: qty 1

## 2020-08-21 MED ORDER — EMPAGLIFLOZIN 10 MG PO TABS
10.0000 mg | ORAL_TABLET | Freq: Every day | ORAL | 11 refills | Status: DC
  Filled 2020-08-21: qty 30, 30d supply, fill #0

## 2020-08-21 MED ORDER — DOXYCYCLINE HYCLATE 100 MG PO TABS
100.0000 mg | ORAL_TABLET | Freq: Two times a day (BID) | ORAL | 0 refills | Status: DC
  Filled 2020-08-21: qty 3, 2d supply, fill #0

## 2020-08-21 MED ORDER — ASPIRIN 81 MG PO CHEW
81.0000 mg | CHEWABLE_TABLET | Freq: Every day | ORAL | 3 refills | Status: DC
  Filled 2020-08-21: qty 90, 90d supply, fill #0

## 2020-08-21 MED ORDER — NITROGLYCERIN 0.4 MG SL SUBL
0.4000 mg | SUBLINGUAL_TABLET | SUBLINGUAL | 1 refills | Status: AC | PRN
  Filled 2020-08-21: qty 100, 28d supply, fill #0

## 2020-08-21 MED FILL — Heparin Sod (Porcine)-NaCl IV Soln 1000 Unit/500ML-0.9%: INTRAVENOUS | Qty: 500 | Status: AC

## 2020-08-21 NOTE — TOC Benefit Eligibility Note (Signed)
Transition of Care [facility]) Benefit Eligibility Note    Patient Details  Name: John Wilkerson MRN: [id number] Date of Birth: 05-31-1960   Medication/Dose: Marden Noble 90mg . bid for 30 day supply  Covered?: Yes  Tier: 2 Drug  Prescription Coverage Preferred Pharmacy: [facility]  Spoke with Person/Company/Phone Number:: Jose S. [employer]. Ph# [phone]  Co-Pay: $55.00  Prior Approval: No  Deductible:  (no deductible)       [phone] Phone Number: 08/21/2020, 11:55 AM

## 2020-08-21 NOTE — Discharge Summary (Signed)
Discharge Summary    Patient ID: [name] MRN: [id number]; DOB: 08-Apr-1960  Admit date: 08/16/2020 Discharge date: 08/21/2020  PCP:  Pcp, No   [facility] Providers Cardiologist:  New to Dr. 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 A drug-eluting stent was successfully placed using a STENT RESOLUTE ONYX 3.5X22. Post intervention, there is a 0% residual stenosis.   1. Severe mid LAD stenosis 2. Successful PTCA/DES x 1 mid LAD   Recommendations: Continue DAPT with ASA/Brilinta for 12 months   Diagnostic Dominance: Right    Intervention     Renal US 08/18/2020 Summary:  Renal:     Right: No evidence of right renal artery stenosis. Normal right         Resisitive Index. RRV flow present. 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  Left:  No evidence of left renal artery stenosis. Normal left         Resistive Index. LRV flow present.  Mesenteric:  Normal Celiac artery findings. 70 to 99% stenosis in the superior  mesenteric  artery.   Echo 08/16/2020 1. Left ventricular ejection fraction, by estimation, is 50 to 55%. The  left ventricle has low normal function. The left ventricle has no regional  wall motion abnormalities. There is severe left ventricular hypertrophy.  Left ventricular diastolic  parameters are  indeterminate, but suggestive of restrictive diastolic  filling pattern.   2. Right ventricular systolic function is normal. The right ventricular  size is normal. Tricuspid regurgitation signal is inadequate for assessing  PA pressure.   3. The mitral valve is grossly normal. Mild mitral valve regurgitation.   4. The aortic valve is tricuspid. Aortic valve regurgitation is not  visualized.   5. The inferior vena cava is normal in size with greater than 50%  respiratory variability, suggesting right atrial pressure of 3 mmHg.   Coronary/Graft Acute MI Revascularization  08/16/2020  LEFT HEART CATH AND CORONARY ANGIOGRAPHY    Conclusion  Conclusions: Multivessel coronary artery disease.  Culprit lesion for the patient's inferior STEMI is 95% thrombotic mid/distal RCA stenosis.  In addition, there is 70-80% mid LAD disease, 50% proximal LCx stenosis, and 60% distal RCA lesion extending to the bifurcation followed by moderate to severe RPDA and RPL disease. Severely elevated left ventricular filling pressure (LVEDP 35 mmHg). Successful PCI to mid/distal RCA using Resolute Onyx 3.5 x 18 mm drug-eluting stent (postdilated to 4.1 mm) with 0% residual stenosis and TIMI-3 flow. Brachioradial artery precluding advancement of 30F guide catheter.  Consider using alternative access for future catheterizations.   Recommendations: Dual antiplatelet therapy with aspirin and ticagrelor for at least 12 months. Continue tirofiban infusion for 4 hours. Obtain echocardiogram.  Aggressive secondary prevention, including high intensity statin therapy and smoking cessation. Trend high-sensitivity troponin I until it has peaked, then stop. Check hemoglobin  A1c. Anticipate staged PCI to LAD (could be performed this admission or shortly after discharge if patient is asymptomatic) based on hospital course.   [name], MD [facility]  Diagnostic Dominance: Right    Intervention      History  of Present Illness     John John Wilkerson is a 60 y.o. male with with tobacco use and hiatal hernia who [facility] for the evaluation of chest pain and found to have inferior ST segment elevation with Q waves.   Mr. John Wilkerson reported developing intermittent chest and epigastric pain that he describes as indigestion 3 days ago prior to arrival.  He had noticed several ticks on him the day before and wonders if that could have been related.  The symptoms would wax and wane and were without associated symptoms.  He awoke around 5 AM with more significant chest discomfort that he still describes as indigestion associated with weakness in both arms.  He has had chills but no fevers.  He denies shortness of breath, palpitations, and lightheadedness.  He presented to the [facility] emergency department for further evaluation and was noted to have inferior ST segment elevation with Q waves.  He was given sublingual nitroglycerin with improvement in his chest discomfort, which is still 1-2/10 in intensity.  [facility] for emergent cardiac catheterization.  Hospital Course     Consultants: None  Inferior STEMI - Emergent cath showed multivessel coronary artery disease.  Culprit lesion for the patient's inferior STEMI is 95% thrombotic mid/distal RCA stenosis.  In addition, there is 70-80% mid LAD disease, 50% proximal LCx stenosis, and 60% distal RCA lesion extending to the bifurcation followed by moderate to severe RPDA and RPL disease. Underwent successful PCI to mid/distal RCA. No recurrent angina. Underwent successful staged PCI of LAD stenosis.   He has diffuse residual disease in the distal RCA branches that we will treat medically.  Some shortness of breath with Brilinta which improved with caffeine intake.  Ambulated well with cardiac rehab.  Continue dual antiplatelet therapy with aspirin and Brilinta.  Continue Lipitor 80 mg daily and carvedilol 12.5 mg twice  daily.  2.  Junctional rhythm:  - Following cath he had recurrent episodes of AIVR which was symptomatic>> then episodes of accelerated junctional rhythm which is not symptomatic. - Resolved   3.  Hypertensive heart disease -Patient was severely hypertensive initially.  Severe LVH on echocardiogram.  No evidence of RAS by duplex. -Blood pressure improved on amlodipine 10 mg daily and carvedilol 12.5 mg twice daily.  4.  Acute on chronic kidney disease stage III -Creatinine 1.3 on admission which increased to 1.5>>1.6>> and stable at 1.3 -Related to chronic severe hypertension and contrast use -Consider adding ACE/ARB in outpatient setting  5.  Acute on chronic systolic and diastolic heart failure -LVEDP 35 mmHg by cardiac catheterization -Echocardiogram showed LV function of 50 to 55% and intermediate diastolic parameter -Given intermittent Lasix -Euvolemic by exam -Discussed low-sodium diet -Continue beta-blocker -ACE/ARB in outpatient setting. Check BMP at follow up. -Add Jardiance at discharge  6.  Smoking -He is motivated to quit smoking -We will continue nicotine patch at discharge  7.  Hyperlipidemia - 08/16/2020: Cholesterol 223; HDL 34; LDL Cholesterol 153; Triglycerides 180; VLDL 36  -Continue high intensity statin -Lipid panel and LFTs in 6 to 8 weeks  8. Tick bite - He will complete course of abx   Did the patient have an acute coronary syndrome (  MI, NSTEMI, STEMI, etc) this admission?:  Yes                               AHA/ACC Clinical Performance & Quality Measures: Aspirin prescribed? - Yes ADP Receptor Inhibitor (Plavix/Clopidogrel, Brilinta/Ticagrelor or Effient/Prasugrel) prescribed (includes medically managed patients)? - Yes Beta Blocker prescribed? - Yes High Intensity Statin (Lipitor 40-80mg  or Crestor 20-40mg ) prescribed? - Yes EF assessed during THIS hospitalization? - Yes For EF <40%, was ACEI/ARB prescribed? - Not Applicable (EF >/= 40%) For EF  <40%, Aldosterone Antagonist (Spironolactone or Eplerenone) prescribed? - Not Applicable (EF >/= 40%) Cardiac Rehab Phase II ordered (including medically managed patients)? - Yes     Discharge Vitals Blood pressure (!) 160/89, pulse 76, temperature 98.3 F (36.8 C), temperature source Oral, resp. rate 16, height  (1.778 m), weight 101.6 kg, SpO2 100 %.  Filed Weights   08/16/20 0739 08/19/20 0600  Weight: 102.1 kg 101.6 kg   Physical Exam Constitutional:      Appearance: He is obese.  HENT:     Head: Normocephalic.  Eyes:     Extraocular Movements: Extraocular movements intact.     Pupils: Pupils are equal, round, and reactive to light.  Cardiovascular:     Rate and Rhythm: Normal rate and regular rhythm.     Pulses: Normal pulses.     Heart sounds: Normal heart sounds.     Comments: Right groin cath site without hematoma  Pulmonary:     Effort: Pulmonary effort is normal.     Breath sounds: Normal breath sounds.  Abdominal:     General: Abdomen is flat. Bowel sounds are normal.     Palpations: Abdomen is soft.  Musculoskeletal:     Cervical back: Normal range of motion.     Right lower leg: No edema.     Left lower leg: No edema.  Skin:    General: Skin is warm and dry.  Neurological:     General: No focal deficit present.     Mental Status: He is alert.  Psychiatric:        Mood and Affect: Mood normal.    Labs & Radiologic Studies    CBC Recent Labs    08/21/20 0321  WBC 8.6  HGB 14.1  HCT 42.3  MCV 85.6  PLT 265   Basic Metabolic Panel Recent Labs    16/10/96 0108 08/21/20 0321  NA 136 137  K 3.6 3.4*  CL 105 108  CO2 22 20*  GLUCOSE 108* 101*  BUN 31* 22*  CREATININE 1.62* 1.27*  CALCIUM 8.3* 8.6*   High Sensitivity Troponin:   Recent Labs  Lab 08/16/20 0718 08/16/20 1511  TROPONINIHS 2,351* >24,000*    _____________  CT HEAD WO CONTRAST  Result Date: 08/18/2020 CLINICAL DATA:  Delirium.  Somnolence, confusion and agitation.  EXAM: CT HEAD WITHOUT CONTRAST TECHNIQUE: Contiguous axial images were obtained from the base of the skull through the vertex without intravenous contrast. COMPARISON:  None. FINDINGS: Brain: Generalized cerebral atrophy. No intracranial hemorrhage, mass effect, or midline shift. No hydrocephalus. The basilar cisterns are patent. 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 No evidence of territorial infarct or acute ischemia. No extra-axial or intracranial fluid collection. Vascular: No hyperdense vessel. Skull: No fracture or focal lesion. Sinuses/Orbits: Mucous retention cysts in the right maxillary sinus. Occasional mucosal thickening of ethmoid air cells. Mastoid air cells are clear.  No acute orbital findings. Other: None. IMPRESSION: 1. No acute intracranial abnormality. 2. Generalized cerebral atrophy. 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 Electronically Signed   By: Narda Rutherford M.D.   On: 08/18/2020 22:50   CARDIAC CATHETERIZATION  Result Date: 08/20/2020  Mid LAD-1 lesion is 75% stenosed.  A drug-eluting stent was successfully placed using a STENT RESOLUTE ONYX 3.5X22.  Post intervention, there is a 0% residual stenosis.  1. Severe mid LAD stenosis 2. Successful PTCA/DES x 1 mid LAD Recommendations: Continue DAPT with ASA/Brilinta for 12 months.   CARDIAC CATHETERIZATION  Result Date: 08/16/2020 Conclusions: 1. Multivessel coronary artery disease.  Culprit lesion for the patient's inferior STEMI is 95% thrombotic mid/distal RCA stenosis.  In addition, there is 70-80% mid LAD disease, 50% proximal LCx stenosis, and 60% distal RCA lesion extending to the bifurcation followed by moderate to severe RPDA and RPL disease. 2. Severely elevated left ventricular filling pressure (LVEDP 35 mmHg). 3. Successful PCI to mid/distal RCA using Resolute Onyx 3.5 x 18 mm drug-eluting stent (postdilated to 4.1 mm) with 0% residual stenosis and  TIMI-3 flow. 4. Brachioradial artery precluding advancement of 43F guide catheter.  Consider using alternative access for future catheterizations. Recommendations: 1. Dual antiplatelet therapy with aspirin and ticagrelor for at least 12 months. 2. Continue tirofiban infusion for 4 hours. 3. Obtain echocardiogram.  Aggressive secondary prevention, including high intensity statin therapy and smoking cessation. 4. Trend high-sensitivity troponin I until it has peaked, then stop. 5. Check hemoglobin A1c. 6. Anticipate staged PCI to LAD (could be performed this admission or shortly after discharge if patient is asymptomatic) based on hospital course. Yvonne Kendall, MD [facility]   DG Chest Port 1 View  Result Date: 08/17/2020 CLINICAL DATA:  Dyspnea EXAM: PORTABLE CHEST 1 VIEW COMPARISON:  Portable exam 1602 hours compared to 08/16/2020 FINDINGS: Enlargement of cardiac silhouette. Mediastinal contours normal. Airspace infiltrates in the mid to upper lungs, increased, could represent pneumonia or pulmonary edema. No pleural effusion or pneumothorax. Osseous structures unremarkable. IMPRESSION: Increased infiltrates in the mid to upper lungs question multifocal pneumonia versus pulmonary edema. Electronically Signed   By: Ulyses Southward M.D.   On: 08/17/2020 16:11   DG Chest Port 1 View  Result Date: 08/16/2020 CLINICAL DATA:  Chest pain EXAM: PORTABLE CHEST 1 VIEW COMPARISON:  None. FINDINGS: A portion of the lung bases not visualized. There is interstitial pulmonary edema with potential underlying fibrotic type change. No air space consolidation in visualized lungs. There is questionable bullous disease in the upper lobes. Heart is mildly enlarged with pulmonary vascularity within normal limits. No evident adenopathy. No bone lesions. No evident pneumothorax. IMPRESSION: Portions of lung bases not visualized. Cardiomegaly with a degree of interstitial pulmonary edema may reflect a degree of underlying congestive  heart failure. 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 Electronically Signed   By: Bretta Bang III M.D.   On: 08/16/2020 08:17   ECHOCARDIOGRAM COMPLETE  Result Date: 08/16/2020    ECHOCARDIOGRAM REPORT   Patient Name:   John Wilkerson Date of Exam: 08/16/2020 Medical Rec #:  [id number]      Height:       73.5 in Accession #:    [id number]     Weight:       225.0 lb Date of Birth:  [birth year]      BSA:          2.274 m Patient  Age:    59 years       BP:           121/60 mmHg Patient Gender: M              HR:           61 bpm. Exam Location:  Inpatient Procedure: 2D Echo, Color Doppler, Cardiac Doppler and 3D Echo Indications:    Acute MI i21.9  History:        Patient has no prior history of Echocardiogram examinations.                 CAD; Risk Factors:Hypertension.  Sonographer:    Irving Burton Senior RDCS Referring Phys: [phone] CHRISTOPHER END IMPRESSIONS  1. Left ventricular ejection fraction, by estimation, is 50 to 55%. The left ventricle has low normal function. The left ventricle has no regional wall motion abnormalities. There is severe left ventricular hypertrophy. Left ventricular diastolic parameters are indeterminate, but suggestive of restrictive diastolic filling pattern.  2. Right ventricular systolic function is normal. The right ventricular size is normal. Tricuspid regurgitation signal is inadequate for assessing PA pressure.  3. The mitral valve is grossly normal. Mild mitral valve regurgitation.  4. The aortic valve is tricuspid. Aortic valve regurgitation is not visualized.  5. The inferior vena cava is normal in size with greater than 50% respiratory variability, suggesting right atrial pressure of 3 mmHg. FINDINGS  Left Ventricle: Left ventricular ejection fraction, by estimation, is 50 to 55%. The left ventricle has low normal function. The left ventricle has no regional wall motion abnormalities. The left ventricular internal cavity size  was normal in size. There is severe left ventricular hypertrophy. Left ventricular diastolic parameters are indeterminate. Right Ventricle: The right ventricular size is normal. No increase in right ventricular wall thickness. Right ventricular systolic function is normal. Tricuspid regurgitation signal is inadequate for assessing PA pressure. Left Atrium: Left atrial size was normal in size. Right Atrium: Right atrial size was normal in size. Pericardium: There is no evidence of pericardial effusion. Mitral Valve: The mitral valve is grossly normal. Mild mitral annular calcification. Mild mitral valve regurgitation. Tricuspid Valve: The tricuspid valve is grossly normal. Tricuspid valve regurgitation is trivial. Aortic Valve: The aortic valve is tricuspid. There is mild aortic valve annular calcification. Aortic valve regurgitation is not visualized. Pulmonic Valve: The pulmonic valve was grossly normal. Pulmonic valve regurgitation is trivial. Aorta: The aortic root is normal in size and structure. Venous: The inferior vena cava is normal in size with greater than 50% respiratory variability, suggesting right atrial pressure of 3 mmHg. IAS/Shunts: No atrial level shunt detected by color flow Doppler.  LEFT VENTRICLE PLAX 2D LVIDd:         5.30 cm  Diastology LVIDs:         3.75 cm  LV e' medial:    5.66 cm/s LV PW:         1.60 cm  LV E/e' medial:  18.1 LV IVS:        1.30 cm  LV e' lateral:   5.00 cm/s LVOT diam:     2.20 cm  LV E/e' lateral: 20.5 LV SV:         60 LV SV Index:   26 LVOT Area:     3.80 cm  RIGHT VENTRICLE RV S prime:     15.10 cm/s TAPSE (M-mode): 2.6 cm LEFT ATRIUM             Index  RIGHT ATRIUM           Index LA diam:        4.45 cm 1.96 cm/m  RA Area:     18.60 cm LA Vol (A2C):   68.4 ml 30.08 ml/m RA Volume:   52.50 ml  23.09 ml/m LA Vol (A4C):   58.9 ml 25.91 ml/m LA Biplane Vol: 64.7 ml 28.46 ml/m  AORTIC VALVE LVOT Vmax:   91.40 cm/s LVOT Vmean:  61.400 cm/s LVOT VTI:     0.157 m  AORTA Ao Root diam: 3.40 cm Ao Asc diam:  3.40 cm MITRAL VALVE MV Area (PHT): 3.42 cm     SHUNTS MV Decel Time: 222 msec     Systemic VTI:  0.16 m MV E velocity: 102.50 cm/s  Systemic Diam: 2.20 cm MV A velocity: 50.55 cm/s MV E/A ratio:  2.03 Nona Dell MD Electronically signed by Nona Dell MD Signature Date/Time: 08/16/2020/1:15:15 PM    Final    VAS US RENAL ARTERY DUPLEX  Result Date: 08/18/2020 ABDOMINAL VISCERAL Patient Name:  John Wilkerson  Date of Exam:   08/18/2020 Medical Rec #: [id number]       Accession #:    [id number] Date of Birth: 01/23/1961       Patient Gender: M Patient Age:   59Y Exam [facility]  [facility] Procedure:      VAS US RENAL ARTERY DUPLEX Referring Phys: [id number] [facility] CROITORU -------------------------------------------------------------------------------- Indications: Hypertension. Limitations: Air/bowel gas, obesity and patient discomfort. Comparison Study: No prior studies. Performing Technologist: Jean Rosenthal RDMS,RVT  Examination Guidelines: A complete evaluation includes B-mode imaging, spectral Doppler, color Doppler, and power Doppler as needed of all accessible portions of each vessel. Bilateral testing is considered an integral part of a complete examination. Limited examinations for reoccurring indications may be performed as noted.  Duplex Findings: +----------------------+--------+--------+------+------------------------------+ Mesenteric            PSV cm/sEDV cm/sPlaque           Comments            +----------------------+--------+--------+------+------------------------------+ Aorta Prox               76                                                +----------------------+--------+--------+------+------------------------------+ Aorta Mid                56                     Difficult to visualize                                                   secondary to body habitus,                                                   movement, and bowel gas     +----------------------+--------+--------+------+------------------------------+ Celiac Artery Proximal  138                                                +----------------------+--------+--------+------+------------------------------+  SMA Origin              295                                                +----------------------+--------+--------+------+------------------------------+ SMA Proximal            347                                                +----------------------+--------+--------+------+------------------------------+    +------------------+--------+--------+-------+ Right Renal ArteryPSV cm/sEDV cm/sComment +------------------+--------+--------+-------+ Origin              168      38           +------------------+--------+--------+-------+ Proximal            162      47           +------------------+--------+--------+-------+ Mid                 129      30           +------------------+--------+--------+-------+ Distal              125      41           +------------------+--------+--------+-------+ +-----------------+--------+--------+-------+ Left Renal ArteryPSV cm/sEDV cm/sComment +-----------------+--------+--------+-------+ Origin              92      25           +-----------------+--------+--------+-------+ Proximal            98      22           +-----------------+--------+--------+-------+ Mid                107      25           +-----------------+--------+--------+-------+ Distal              95      37           +-----------------+--------+--------+-------+ +------------+--------+--------+----+-----------+--------+--------+----+ Right KidneyPSV cm/sEDV cm/sRI  Left KidneyPSV cm/sEDV cm/sRI   +------------+--------+--------+----+-----------+--------+--------+----+ Upper Pole  29      11      0.62Upper Pole 47      14      0.70  +------------+--------+--------+----+-----------+--------+--------+----+ Mid         36      14      0.        30      10      0.67 +------------+--------+--------+----+-----------+--------+--------+----+ Lower Pole  22      9       0.58Lower Pole 39      12      0.69 +------------+--------+--------+----+-----------+--------+--------+----+ Hilar       113     36      0.68Hilar      43      12      0.71 +------------+--------+--------+----+-----------+--------+--------+----+ +------------------+----------------------------------+------------------+-----+ Right Kidney  Left Kidney             +------------------+----------------------------------+------------------+-----+ RAR                                                 RAR                     +------------------+----------------------------------+------------------+-----+ RAR (manual)      2.21                              RAR (manual)      1.41  +------------------+----------------------------------+------------------+-----+ Cortex            Unable to obtain secondary to     Cortex            0.56                    patient movement                                          +------------------+----------------------------------+------------------+-----+ Cortex thickness                                    Corex thickness         +------------------+----------------------------------+------------------+-----+ Kidney length (cm)12.47                             Kidney length (cm)13.95 +------------------+----------------------------------+------------------+-----+  Summary: Renal:  Right: No evidence of right renal artery stenosis. Normal right        Resisitive Index. RRV flow present. 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 Left:  No evidence of left renal artery stenosis. Normal left        Resistive Index. LRV flow  present. Mesenteric: Normal Celiac artery findings. 70 to 99% stenosis in the superior mesenteric artery.  *See table(s) above for measurements and observations.  Diagnosing physician: Heath Lark  Electronically signed by Heath Lark on 08/18/2020 at 2:53:10 PM.    Final    Disposition   Pt is being discharged home today in good condition.  Follow-up Plans & Appointments     Follow-up Information     [facility]. Go on 08/28/2020.   Specialty: Cardiology Why: @9 :40am for hospital follow with Dr. information: [address] [place] [phone]               Discharge Instructions     Amb Referral to Cardiac Rehabilitation   Complete by: As directed    Diagnosis:  Coronary Stents STEMI     After initial evaluation and assessments completed: Virtual Based Care may be provided alone or in conjunction with Phase 2 Cardiac Rehab based on patient barriers.: Yes   Diet - low sodium heart healthy   Complete by: As directed    Discharge instructions   Complete by: As directed    No driving for 2 weeks. No lifting over 10 lbs for 4 weeks. No sexual activity  for 4 weeks. You may not return to work until cleared by your cardiologist. Keep procedure site clean & dry. If you notice increased pain, swelling, bleeding or pus, call/return!  You may shower, but no soaking baths/hot tubs/pools for 1 week.   Increase activity slowly   Complete by: As directed        Discharge Medications   Allergies as of 08/21/2020       Reactions   Codeine Other (See Comments)   REACTION: Tunnel vision and blackouts        Medication List     STOP taking these medications    aspirin EC 81 MG tablet Replaced by: aspirin 81 MG chewable tablet       TAKE these medications    amLODipine 10 MG tablet Commonly known as: NORVASC Take 1 tablet (10 mg total) by mouth daily.   aspirin 81 MG chewable  tablet Chew 1 tablet (81 mg total) by mouth daily. Replaces: aspirin EC 81 MG tablet   atorvastatin 80 MG tablet Commonly known as: LIPITOR Take 1 tablet (80 mg total) by mouth daily.   carvedilol 12.5 MG tablet Commonly known as: COREG Take 1 tablet (12.5 mg total) by mouth 2 (two) times daily with a meal.   doxycycline 100 MG tablet Commonly known as: VIBRA-TABS Take 1 tablet (100 mg total) by mouth every 12 (twelve) hours.   empagliflozin 10 MG Tabs tablet Commonly known as: JARDIANCE Take 1 tablet (10 mg total) by mouth daily.   FISH OIL OMEGA-3 PO Take 1 tablet by mouth daily.   MULTIVITAMIN ADULT PO Take 1 tablet by mouth daily.   nicotine 14 mg/24hr patch Commonly known as: NICODERM CQ - dosed in mg/24 hours Place 1 patch (14 mg total) onto the skin daily.   nitroGLYCERIN 0.4 MG SL tablet Commonly known as: NITROSTAT Place 1 tablet (0.4 mg total) under the tongue every 5 (five) minutes x 3 doses as needed for chest pain.   QC TUMERIC COMPLEX PO Take 1 tablet by mouth daily.   ticagrelor 90 MG Tabs tablet Commonly known as: BRILINTA Take 1 tablet (90 mg total) by mouth 2 (two) times daily.           Outstanding Labs/Studies   BMP in one week Consider OP f/u labs 6-8 weeks given statin initiation this admission.   Duration of Discharge Encounter   Greater than 30 minutes including physician time.  Lorelei Pont, PA 08/21/2020, 9:59 AM

## 2020-08-21 NOTE — Telephone Encounter (Signed)
-----   Message from Vallarie Mare sent at 08/21/2020  9:55 AM EDT ----- Scheduled for TOC apt w/ Hochrein on 6/16

## 2020-08-21 NOTE — Progress Notes (Signed)
Pt is alert and oriented. Discharge instructions/ AVS given to pt. 

## 2020-08-21 NOTE — Care Management (Addendum)
08-21-20 Benefits check submitted for Brilinta. Case Manager will follow for cost.    1104 08-21-20 Brilinta co pay card provided to this patient.

## 2020-08-21 NOTE — Progress Notes (Signed)
CARDIAC REHAB PHASE I   PRE:  Rate/Rhythm: 77 SR  BP:  Sitting: 124/97      SaO2: 98 RA  MODE:  Ambulation: 300 ft   POST:  Rate/Rhythm: 81 SR  BP:  Sitting: 164/80    SaO2: 97 RA  Pt ambulated 362ft in hallway standby assist with front wheel walker. Reinforced importance of ASA and Brilinta. Reviewed site care, restrictions, and exercise guidelines. Pts with with paperwork from Monday. Questions and concerns addressed. Will refer to [facility]. Pt hopeful for d/c today.  [id number] Reynold Bowen, RN BSN 08/21/2020 9:42 AM

## 2020-08-25 ENCOUNTER — Emergency Department (HOSPITAL_COMMUNITY)
Admission: EM | Admit: 2020-08-25 | Discharge: 2020-08-25 | Disposition: A | Discharge status: Home / Self Care | Payer: No Typology Code available for payment source | Attending: Emergency Medicine | Admitting: Emergency Medicine

## 2020-08-25 ENCOUNTER — Other Ambulatory Visit: Payer: Self-pay

## 2020-08-25 ENCOUNTER — Encounter (HOSPITAL_COMMUNITY): Payer: Self-pay | Admitting: Emergency Medicine

## 2020-08-25 DIAGNOSIS — Z79899 Other long term (current) drug therapy: Secondary | ICD-10-CM | POA: Insufficient documentation

## 2020-08-25 DIAGNOSIS — R21 Rash and other nonspecific skin eruption: Secondary | ICD-10-CM | POA: Diagnosis not present

## 2020-08-25 DIAGNOSIS — Z7982 Long term (current) use of aspirin: Secondary | ICD-10-CM | POA: Insufficient documentation

## 2020-08-25 DIAGNOSIS — I130 Hypertensive heart and chronic kidney disease with heart failure and stage 1 through stage 4 chronic kidney disease, or unspecified chronic kidney disease: Secondary | ICD-10-CM | POA: Diagnosis not present

## 2020-08-25 DIAGNOSIS — F17210 Nicotine dependence, cigarettes, uncomplicated: Secondary | ICD-10-CM | POA: Diagnosis not present

## 2020-08-25 DIAGNOSIS — I5043 Acute on chronic combined systolic (congestive) and diastolic (congestive) heart failure: Secondary | ICD-10-CM | POA: Diagnosis not present

## 2020-08-25 DIAGNOSIS — Z955 Presence of coronary angioplasty implant and graft: Secondary | ICD-10-CM | POA: Insufficient documentation

## 2020-08-25 DIAGNOSIS — N182 Chronic kidney disease, stage 2 (mild): Secondary | ICD-10-CM | POA: Insufficient documentation

## 2020-08-25 MED ORDER — HYDROXYZINE HCL 25 MG PO TABS: 25.0000 mg | ORAL_TABLET | Freq: Four times a day (QID) | ORAL | 0 refills | Status: DC | PRN

## 2020-08-25 NOTE — Telephone Encounter (Signed)
Patient contacted regarding discharge from [facility] on 08/21/2020.  Patient understands to follow up with Dr. Antoine Poche on 08/28/2020 at 9:40 am at [facility]. Patient understands his discharge instructions. Patient understands his medications and regimen. Patient understands to bring all his medications to this visit.

## 2020-08-25 NOTE — ED Triage Notes (Signed)
Pt c/o hives all over torso that started today. Pt dx from [facility] after having MI, pt placed on several new medications.

## 2020-08-25 NOTE — Discharge Instructions (Addendum)
Your rash today appears consistent with a rash from medications.  Unfortunately, you are on several new medications and they are are mostly necessary. Stop the doxycycline. Take hydroxyzine as needed as prescribed for itching.  This may make you feel sleepy.  This is in the same family as Benadryl, do not take any oral Benadryl or apply topical Benadryl. Follow-up with your provider on Thursday as scheduled, return to the emergency room for worsening or concerning symptoms.

## 2020-08-25 NOTE — ED Provider Notes (Signed)
[facility] EMERGENCY DEPARTMENT Provider Note   CSN: [id number] Arrival date & time: 08/25/20  1858     History Chief Complaint  Patient presents with   Rash    John Wilkerson is a 60 y.o. male.  60 year old male presents with complaint of rash.  Patient was admitted to the hospital last week with a STEMI, treated with stent placement and started on multiple medications including aspirin, Lipitor, Norvasc, Brilinta, Jardiance, Coreg, doxycycline.  Patient started on these medications 4 days ago, noticed 3 days ago that he had some itching to both lower back and then noticed the rash yesterday, progressively worsening.  Denies shortness of breath, difficulty swallowing.  Patient states that other than aspirin, he has never taken any of these other medications for the past.  Scheduled to follow with cardiology in 3 days.      Past Medical History:  Diagnosis Date   Hiatal hernia     Patient Active Problem List   Diagnosis Date Noted   Unstable angina (HCC) 08/20/2020   Acute on chronic combined systolic and diastolic CHF (congestive heart failure) (HCC) 08/19/2020   CKD (chronic kidney disease), stage II 08/18/2020   Hypertensive heart disease with heart failure (HCC) 08/18/2020   STEMI (ST elevation myocardial infarction) (HCC) 08/16/2020   STEMI involving right coronary artery (HCC) 08/16/2020   ANXIETY 07/18/2008   TOBACCO USER 07/18/2008   Essential hypertension 07/18/2008   GERD 07/18/2008   HIATAL HERNIA 07/18/2008   ARTHRITIS 07/18/2008   LOW BACK PAIN, CHRONIC 07/18/2008    Past Surgical History:  Procedure Laterality Date   CORONARY STENT INTERVENTION N/A 08/20/2020   Procedure: CORONARY STENT INTERVENTION;  Surgeon: Kathleene Hazel, MD;  Location: [facility] INVASIVE CV LAB;  Service: Cardiovascular;  Laterality: N/A;   CORONARY/GRAFT ACUTE MI REVASCULARIZATION N/A 08/16/2020   Procedure: Coronary/Graft Acute MI Revascularization;  Surgeon: Yvonne Kendall, MD;   Location: [facility] INVASIVE CV LAB;  Service: Cardiovascular;  Laterality: N/A;   INTRAVASCULAR ULTRASOUND/IVUS N/A 08/20/2020   Procedure: Intravascular Ultrasound/IVUS;  Surgeon: Kathleene Hazel, MD;  Location: [facility] INVASIVE CV LAB;  Service: Cardiovascular;  Laterality: N/A;   LEFT HEART CATH AND CORONARY ANGIOGRAPHY N/A 08/16/2020   Procedure: LEFT HEART CATH AND CORONARY ANGIOGRAPHY;  Surgeon: Yvonne Kendall, MD;  Location: [facility] INVASIVE CV LAB;  Service: Cardiovascular;  Laterality: N/A;       Family History  Problem Relation Age of Onset   AAA (abdominal aortic aneurysm) Father     Social History   Tobacco Use   Smoking status: Every Day    Packs/day: 1.00    Pack years: 0.00    Types: Cigarettes   Smokeless tobacco: Never  Substance Use Topics   Alcohol use: Not Currently   Drug use: Not Currently    Home Medications Prior to Admission medications   Medication Sig Start Date End Date Taking? Authorizing Provider  hydrOXYzine (ATARAX/VISTARIL) 25 MG tablet Take 1 tablet (25 mg total) by mouth every 6 (six) hours as needed for itching. 08/25/20  Yes Jeannie Fend, PA-C  amLODipine (NORVASC) 10 MG tablet Take 1 tablet (10 mg total) by mouth daily. 08/21/20   Manson Passey, PA  aspirin 81 MG chewable tablet Chew 1 tablet (81 mg total) by mouth daily. 08/21/20   Bhagat, Sharrell Ku, PA  atorvastatin (LIPITOR) 80 MG tablet Take 1 tablet (80 mg total) by mouth daily. 08/21/20   Bhagat, Sharrell Ku, PA  carvedilol (COREG) 12.5 MG tablet Take 1 tablet (12.5 mg  total) by mouth 2 (two) times daily with a meal. 08/21/20   Bhagat, Bhavinkumar, PA  empagliflozin (JARDIANCE) 10 MG TABS tablet Take 1 tablet (10 mg total) by mouth daily. 08/21/20   Manson Passey, PA  Multiple Vitamin (MULTIVITAMIN ADULT PO) Take 1 tablet by mouth daily.    [provider]  nicotine (NICODERM CQ - DOSED IN MG/24 HOURS) 14 mg/24hr patch Place 1 patch (14 mg total) onto the skin daily. 08/21/20    Bhagat, Sharrell Ku, PA  nitroGLYCERIN (NITROSTAT) 0.4 MG SL tablet Place 1 tablet (0.4 mg total) under the tongue every 5 (five) minutes x 3 doses as needed for chest pain. 08/21/20   Bhagat, Sharrell Ku, PA  Omega-3 Fatty Acids (FISH OIL OMEGA-3 PO) Take 1 tablet by mouth daily.    [provider]  ticagrelor (BRILINTA) 90 MG TABS tablet Take 1 tablet (90 mg total) by mouth 2 (two) times daily. 08/21/20   Bhagat, Sharrell Ku, PA  Turmeric (QC TUMERIC COMPLEX PO) Take 1 tablet by mouth daily.    [provider]    Allergies    Codeine  Review of Systems   Review of Systems  Constitutional:  Negative for chills and fever.  HENT:  Negative for trouble swallowing and voice change.   Respiratory:  Negative for shortness of breath and wheezing.   Cardiovascular:  Negative for chest pain.  Musculoskeletal:  Negative for arthralgias, joint swelling and myalgias.  Skin:  Positive for rash.  Allergic/Immunologic: Negative for immunocompromised state.  Hematological:  Bruises/bleeds easily.  All other systems reviewed and are negative.  Physical Exam Updated Vital Signs BP (!) 152/81 (BP Location: Right Arm)   Pulse 74   Temp 98 F (36.7 C) (Oral)   Resp 16   Ht 5\' 10"  (1.778 m)   Wt 101.6 kg   SpO2 99%   BMI 32.14 kg/m   Physical Exam Vitals and nursing note reviewed.  Constitutional:      General: He is not in acute distress.    Appearance: He is well-developed. He is not diaphoretic.  HENT:     Head: Normocephalic and atraumatic.  Pulmonary:     Effort: Pulmonary effort is normal.  Musculoskeletal:     Right lower leg: No edema.     Left lower leg: No edema.  Skin:    General: Skin is warm and dry.     Findings: Bruising and rash present.     Comments: Raised, blanching, coalescing lesions to trunk and groin areas, more concentrated across the low back, axillary areas and groin.  Neurological:     Mental Status: He is alert and oriented to person, place,  and time.     Gait: Gait normal.  Psychiatric:        Behavior: Behavior normal.          ED Results / Procedures / Treatments   Labs (all labs ordered are listed, but only abnormal results are displayed) Labs Reviewed - No data to display  EKG None  Radiology No results found.  Procedures Procedures   Medications Ordered in ED Medications - No data to display  ED Course  I have reviewed the triage vital signs and the nursing notes.  Pertinent labs & imaging results that were available during my care of the patient were reviewed by me and considered in my medical decision making (see chart for details).  Clinical Course as of 08/25/20 2023  Mon Aug 25, 2020  1390 59 year old male with rash.  Pictures added to chart, he is on multiple new medications which could have caused this rash.  He does not have any respiratory complaints at this time.  Medication list reviewed, recommend he discontinue the doxycycline however he finished this course 4 days ago. Considered discontinuing Jardiance as patient is not a diabetic however he states he was put on this medication acknowledging he is not a diabetic and had something to do with his stent.  Patient is to call cardiology in the morning to discuss this further.  Discussed with Dr. Lynelle Doctor, ER attending, agrees with plan of care.  Patient is discharged with prescription for hydroxyzine for his itching. [LM]    Clinical Course User Index [LM] Alden Hipp   MDM Rules/Calculators/A&P                           Final Clinical Impression(s) / ED Diagnoses Final diagnoses:  Rash    Rx / DC Orders ED Discharge Orders          Ordered    hydrOXYzine (ATARAX/VISTARIL) 25 MG tablet  Every 6 hours PRN        08/25/20 2011             Alden Hipp 08/25/20 2023    Linwood Dibbles, MD 08/26/20 1017

## 2020-08-26 ENCOUNTER — Telehealth: Payer: Self-pay | Admitting: Licensed Clinical Social Worker

## 2020-08-26 NOTE — Progress Notes (Signed)
  Heart and Vascular Care Navigation  08/26/2020  MONTARIUS KITAGAWA [birth year] [id number]  Reason for Referral:    Engaged with patient by telephone for initial visit for Heart and Vascular Care Coordination.                                                                                                   Assessment:       LCSW f/u with pt via telephone. Reached him at [phone]. Introduced self, role, reason for call. Pt aware he has an appt on 5/16 w/ Dr. Antoine Poche at [facility] office. He is going to be able to get to the appointment w/o difficulty. He confirmed home address, shares he still has commercial coverage and Medicare. He is disabled and does not work. Pt shares that he does not have a PCP. He was provided a list of PCP providers from my colleague Revonda Standard. I inquired if it made it home with him- he is not sure. Since pt does have commercial insurance as his primary he was encouraged to call the member services number on his card (sometimes listed as benefits) and request they send him a list of in network providers. Pt shared he is doing better now that he is home, was encouraged to call if any additional questions or concerns.                                HRT/VAS Care Coordination     Patients Home Cardiology Office [facility]   Outpatient Care Team Social Worker   Social Worker Name: Octavio Graves, LCSW, [facility]   Living arrangements for the past 2 months Single Family Home   Lives with: Spouse   Patient Current Insurance Coverage Traditional Medicare; Nurse, learning disability   Patient Has Concern With Paying Medical Bills No   Does Patient Have Prescription Coverage? Yes   Home Assistive Devices/Equipment None       Social History:                                                                             SDOH Screenings   Alcohol Screen: Not on file  Depression (PHQ2-9): Not on file  Financial Resource Strain: Not on file  Food Insecurity: Not on file   Housing: Not on file  Physical Activity: Not on file  Social Connections: Not on file  Stress: Not on file  Tobacco Use: High Risk   Smoking Tobacco Use: Every Day   Smokeless Tobacco Use: Never  Transportation Needs: Not on file     Follow-up plan:   Pt has my number for any additional questions/concerns moving forward.

## 2020-08-27 NOTE — Progress Notes (Signed)
Cardiology Office Note   Date:  08/28/2020   ID:  ISMAR John Wilkerson, DOB 1960/11/03, MRN [id number]  PCP:  Pcp, No  Cardiologist:   Rollene Rotunda, MD   Chief Complaint  Patient presents with   Rash       History of Present Illness: John Wilkerson is a 60 y.o. male who presents for [facility] follow up for an acute MI that he had earlier this month.  He had an inferior STEMI.  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  He underwent successful PCI.  He also had a 70 to 80% LAD stenosis that was treated in a staged procedure.  He did have a junctional heart rhythm but was asymptomatic.  He had evidence of hypertensive heart disease with severe LVH on echo.  There was no evidence of renal artery stenosis.  He had renal insufficiency with a creatinine of 1.3.  Since his discharge he was in the emergency room with a diffuse rash.  He was prescribed hydroxyzine.  This was thought to be a drug rash but it is not clear which medication could cause this.  I reviewed the ER records for this visit.  He does think that his rash is slowly getting better.  He still itching.  He wonders if it might not have been related to the doxycycline which she had completed prior to his ER visit.  He is fatigued.  However, he is not getting any of the chest discomfort that he was having.  He is not having any new shortness of breath, PND or orthopnea.  He is committed to slowly increasing his physical activity and has been doing some walking.  He is not smoking cigarettes and is wearing a patch.  He is committed to a better diet.  Of note he has been describing some bloody mucus discharge when he blows his nose.   Past Medical History:  Diagnosis Date   Hiatal hernia     Past Surgical History:  Procedure Laterality Date   CORONARY STENT INTERVENTION N/A 08/20/2020   Procedure: CORONARY STENT INTERVENTION;  Surgeon: Kathleene Hazel, MD;  Location: [facility];  Service:  Cardiovascular;  Laterality: N/A;   CORONARY/GRAFT ACUTE MI REVASCULARIZATION N/A 08/16/2020   Procedure: Coronary/Graft Acute MI Revascularization;  Surgeon: Yvonne Kendall, MD;  Location: [facility];  Service: Cardiovascular;  Laterality: N/A;   INTRAVASCULAR ULTRASOUND/IVUS N/A 08/20/2020   Procedure: Intravascular Ultrasound/IVUS;  Surgeon: Kathleene Hazel, MD;  Location: [facility];  Service: Cardiovascular;  Laterality: N/A;   LEFT HEART CATH AND CORONARY ANGIOGRAPHY N/A 08/16/2020   Procedure: LEFT HEART CATH AND CORONARY ANGIOGRAPHY;  Surgeon: Yvonne Kendall, MD;  Location: [facility];  Service: Cardiovascular;  Laterality: N/A;     Current Outpatient Medications  Medication Sig Dispense Refill   amLODipine (NORVASC) 10 MG tablet Take 1 tablet (10 mg total) by mouth daily. 30 tablet 11   aspirin 81 MG chewable tablet Chew 1 tablet (81 mg total) by mouth daily. 90 tablet 3   atorvastatin (LIPITOR) 80 MG tablet Take 1 tablet (80 mg total) by mouth daily. 30 tablet 11   carvedilol (COREG) 12.5 MG tablet Take 1 tablet (12.5 mg total) by mouth 2 (two) times daily with a meal. 60 tablet 11   empagliflozin (JARDIANCE) 10 MG TABS tablet Take 1 tablet (10 mg total) by mouth daily. 30 tablet 11   hydrOXYzine (ATARAX/VISTARIL) 25  MG tablet Take 1 tablet (25 mg total) by mouth every 6 (six) hours as needed for itching. 12 tablet 0   Multiple Vitamin (MULTIVITAMIN ADULT PO) Take 1 tablet by mouth daily.     nicotine (NICODERM CQ - DOSED IN MG/24 HOURS) 14 mg/24hr patch Place 1 patch (14 mg total) onto the skin daily. 28 patch 0   nitroGLYCERIN (NITROSTAT) 0.4 MG SL tablet Place 1 tablet (0.4 mg total) under the tongue every 5 (five) minutes x 3 doses as needed for chest pain. 100 tablet 1   Omega-3 Fatty Acids (FISH OIL OMEGA-3 PO) Take 1 tablet by mouth daily.     ticagrelor (BRILINTA) 90 MG TABS tablet Take 1 tablet (90 mg total) by mouth 2 (two) times daily. 60  tablet 11   Turmeric (QC TUMERIC COMPLEX PO) Take 1 tablet by mouth daily.     No current facility-administered medications for this visit.    Allergies:   Codeine and Doxycycline    ROS:  Please see the history of present illness.   Otherwise, review of systems are positive for none.   All other systems are reviewed and negative.    PHYSICAL EXAM: VS:  BP (!) 144/80   Pulse 66   Ht 5\' 11"  (1.803 m)   Wt 212 lb (96.2 kg)   SpO2 97%   BMI 29.57 kg/m  , BMI Body mass index is 29.57 kg/m. GENERAL:  Well appearing HEENT:  Pupils equal round and reactive, fundi not visualized, oral mucosa unremarkable NECK:  No jugular venous distention, waveform within normal limits, carotid upstroke brisk and symmetric, no bruits, no thyromegaly LYMPHATICS:  No cervical, inguinal adenopathy LUNGS:  Clear to auscultation bilaterally BACK:  No CVA tenderness CHEST:  Unremarkable HEART:  PMI not displaced or sustained,S1 and S2 within normal limits, no S3, no S4, no clicks, no rubs, no murmurs ABD:  Flat, positive bowel sounds normal in frequency in pitch, no bruits, no rebound, no guarding, no midline pulsatile mass, no hepatomegaly, no splenomegaly EXT:  2 plus pulses throughout, no edema, no cyanosis no clubbing, cath site right radial site intact without bruising or bleeding, right femoral site with nonpulsatile slight mass but no ecchymosis or bleeding. SKIN: Fine resolving diffuse trunk rashno nodules NEURO:  Cranial nerves II through XII grossly intact, motor grossly intact throughout PSYCH:  Cognitively intact, oriented to person place and time    EKG:  EKG is ordered today. NA   Recent Labs: 08/16/2020: ALT 25; Magnesium 2.1 08/18/2020: B Natriuretic Peptide 851.5 08/21/2020: BUN 22; Creatinine, Ser 1.27; Hemoglobin 14.1; Platelets 265; Potassium 3.4; Sodium 137    Lipid Panel    Component Value Date/Time   CHOL 223 (H) 08/16/2020 0718   TRIG 180 (H) 08/16/2020 0718   HDL 34 (L)  08/16/2020 0718   CHOLHDL 6.6 08/16/2020 0718   VLDL 36 08/16/2020 0718   LDLCALC 153 (H) 08/16/2020 0718      Wt Readings from Last 3 Encounters:  08/28/20 212 lb (96.2 kg)  08/25/20 223 lb 15.8 oz (101.6 kg)  08/19/20 223 lb 15.8 oz (101.6 kg)      Other studies Reviewed: Additional studies/ records that were reviewed today include: ED records, hospitalization records. Review of the above records demonstrates:  Please see elsewhere in the note.     ASSESSMENT AND PLAN:  Inferior STEMI: He had anatomy as above.  He has had no symptoms.  He is very much engaged with secondary risk reduction.  At this point I am not suggesting a change in medicines.  Junctional rhythm: This seems to have resolved.  If he still fatigued going forward he might need to be reduced on his beta-blocker.   Hypertensive heart disease : His blood pressure is upper limits.  We talked about this.  I am going to continue the meds as listed and he is going to continue to reduce his salt and concentrate on weight loss and see if he is at target.  Of note he had no evidence of renal artery stenosis on duplex.  Acute on chronic kidney disease stage III: We will follow this with a basic metabolic profile today.  He was also hypokalemic  Acute on chronic systolic and diastolic heart failure: He seems to be euvolemic.  We talked about salt and he is actually using salt substitute.   Smoking: He is committed to not smoking.   Hyperlipidemia: The goal will be an LDL less than 70.  He can have this repeated in the weeks to come.  Rash: This might have been the doxycycline.  If it is not better in a week or so I probably would hold his [place].  I have added doxycycline as an allergy   Nose bleed: He has some bloody congestion from his nose.  I told him to use nasal saline and if this is not improved he likely would need to see ENT rather than stopping her blood thinner in this situation.  He understands.   Current  medicines are reviewed at length with the patient today.  The patient does not have concerns regarding medicines.  The following changes have been made:  no change  Labs/ tests ordered today include: None   Orders Placed This Encounter  Procedures   Basic metabolic panel      Disposition:   FU with APP in six weeks.     Signed, Rollene Rotunda, MD  08/28/2020 1:15 PM    [facility]

## 2020-08-28 ENCOUNTER — Telehealth: Payer: Self-pay | Admitting: Licensed Clinical Social Worker

## 2020-08-28 ENCOUNTER — Encounter: Payer: Self-pay | Admitting: Cardiology

## 2020-08-28 ENCOUNTER — Ambulatory Visit (INDEPENDENT_AMBULATORY_CARE_PROVIDER_SITE_OTHER): Payer: No Typology Code available for payment source | Admitting: Cardiology

## 2020-08-28 VITALS — BP 144/80 | HR 66 | Ht 71.0 in | Wt 212.0 lb

## 2020-08-28 DIAGNOSIS — I10 Essential (primary) hypertension: Secondary | ICD-10-CM | POA: Diagnosis not present

## 2020-08-28 DIAGNOSIS — Z79899 Other long term (current) drug therapy: Secondary | ICD-10-CM

## 2020-08-28 DIAGNOSIS — E785 Hyperlipidemia, unspecified: Secondary | ICD-10-CM | POA: Diagnosis not present

## 2020-08-28 DIAGNOSIS — I2119 ST elevation (STEMI) myocardial infarction involving other coronary artery of inferior wall: Secondary | ICD-10-CM

## 2020-08-28 DIAGNOSIS — R21 Rash and other nonspecific skin eruption: Secondary | ICD-10-CM | POA: Diagnosis not present

## 2020-08-28 NOTE — Telephone Encounter (Signed)
LCSW received a call from pt wife John Wilkerson. She had questions regarding PCP information, I shared that [facility] team had provided pt with resources while in the hospital but it is unclear if it made it home w/ pt and I shared that I had sent another list through to them. Encouraged pt wife as I had shared w/ pt, that I recommend they call Member Services number on the back of their card and ask them for a list of providers in network. Pt wife will do so, I encouraged her to call me back if there is anything else I can be of assistance with at this time.   John Wilkerson, MSW, LCSW [facility] Heart/Vascular Care Navigation  [phone]

## 2020-08-28 NOTE — Telephone Encounter (Signed)
LCSW mailed additional copy of PCP lists, along with a reminder for pt to call his insurance to see who is in network. Remain available as needed moving forward.   John Wilkerson, MSW, LCSW [facility] Heart/Vascular Care Navigation  [phone]

## 2020-08-28 NOTE — Patient Instructions (Addendum)
Medication Instructions:  Your physician recommends that you continue on your current medications as directed. Please refer to the Current Medication list given to you today.   Labwork: BMET today at [facility]  Testing/Procedures: none  Follow-Up: Your physician recommends that you schedule a follow-up appointment in: 6 weeks  Any Other Special Instructions Will Be Listed Below (If Applicable).  If you need a refill on your cardiac medications before your next appointment, please call your pharmacy.

## 2020-08-29 ENCOUNTER — Telehealth (HOSPITAL_COMMUNITY): Payer: Self-pay | Admitting: Pharmacist

## 2020-08-29 NOTE — Telephone Encounter (Signed)
Unable to reach patient, will call back at a later date.

## 2020-09-02 LAB — POCT I-STAT 7, (LYTES, BLD GAS, ICA,H+H)
Acid-Base Excess: 0 mmol/L (ref 0.0–2.0)
Bicarbonate: 22.7 mmol/L (ref 20.0–28.0)
Calcium, Ion: 1.11 mmol/L — ABNORMAL LOW (ref 1.15–1.40)
HCT: 39 % (ref 39.0–52.0)
Hemoglobin: 13.3 g/dL (ref 13.0–17.0)
O2 Saturation: 93 %
Patient temperature: 98.6
Potassium: 3.6 mmol/L (ref 3.5–5.1)
Sodium: 139 mmol/L (ref 135–145)
TCO2: 24 mmol/L (ref 22–32)
pCO2 arterial: 30.3 mmHg — ABNORMAL LOW (ref 32.0–48.0)
pH, Arterial: 7.481 — ABNORMAL HIGH (ref 7.350–7.450)
pO2, Arterial: 61 mmHg — ABNORMAL LOW (ref 83.0–108.0)

## 2020-09-03 ENCOUNTER — Telehealth (HOSPITAL_COMMUNITY): Payer: Self-pay | Admitting: Pharmacist

## 2020-09-03 NOTE — Telephone Encounter (Signed)
Unable to reach patient, will try again on another date.

## 2020-09-04 ENCOUNTER — Telehealth (HOSPITAL_COMMUNITY): Payer: Self-pay | Admitting: Pharmacist

## 2020-09-04 NOTE — Telephone Encounter (Signed)
Third and final attempt to reach patient. 

## 2020-09-16 ENCOUNTER — Other Ambulatory Visit (HOSPITAL_COMMUNITY): Payer: Self-pay

## 2020-09-19 ENCOUNTER — Telehealth: Payer: Self-pay | Admitting: Cardiology

## 2020-09-19 NOTE — Telephone Encounter (Signed)
New message     *STAT* If patient is at the pharmacy, call can be transferred to refill team.   1. Which medications need to be refilled? (please list name of each medication and dose if known)  nicotine patch needs 1 box of 14 mg and 2 boxes of 7 mg  2. Which pharmacy/location (including street and city if local pharmacy) is medication to be sent to? [facility] [address] [place]  3. Do they need a 30 day or 90 day supply?  90

## 2020-09-22 MED ORDER — NICOTINE 14 MG/24HR TD PT24: 14.0000 mg | MEDICATED_PATCH | Freq: Every day | TRANSDERMAL | 0 refills | Status: DC

## 2020-09-23 MED ORDER — NICOTINE 14 MG/24HR TD PT24: 14.0000 mg | MEDICATED_PATCH | Freq: Every day | TRANSDERMAL | 2 refills | Status: DC

## 2020-09-23 NOTE — Addendum Note (Signed)
Addended by: Raelyn Number on: 09/23/2020 05:09 PM   Modules accepted: Orders

## 2020-09-23 NOTE — Telephone Encounter (Signed)
Refills has been sent and was ok per Dr. Antoine Poche.

## 2020-10-03 ENCOUNTER — Other Ambulatory Visit: Payer: Self-pay

## 2020-10-03 DIAGNOSIS — Z006 Encounter for examination for normal comparison and control in clinical research program: Secondary | ICD-10-CM

## 2020-10-03 NOTE — Research (Signed)
Subject # [id number] [employer] Lp(a) [id number] Site # [phone]  SEX _0    Male                      _1    Male  Ethnicity _2   Hispanic or Latino   _3   Not Hispanic or Latino  Race _4   White                 _5   Black or African American  _6   Asian _7   American [place] or [place] Native            _8   Native Hawaiian or Other Pacific Islander                      _9   Other  Other   Age 60  Subject Group _10    Local Lab           _11   Historical Lp(a) value                                       Results:  Future research _12    Yes                    _13   No to remote source   document review    [employer] [id number] Site # [phone] Subject ID # [id number]         ELIGIBILITY CRITERIA WORKSHEET INCLUSION CRITERIA   Subject has provided informed consent prior to the initiation of any study specific activities/procedures _14   Age 79 to 38 years _15   MI (presumed type 1) OR _16   PCI (with high-risk features) with at least 1 of the following: _17   Age >65 _18   Diabetes mellitus  HbA1c: _19   History of ischemic stroke _20   History of peripheral arterial disease _21   Residual stenosis ? 50% _22   Multivessel PCI (ie, ? 2 vessels, including branch arteries _23   EXCLUSIONS THE FOLLOWING N/A _24   Subjects known to be currently receiving investigational drug in a clinical study that is anticipated to last > 1 year _25   Known Lp(a) value <39m/dL or < 200nmol/L _26   Subject has a diagnosis of end-stage renal disease or requires dialysis. _27   Poorly controlled (glycated hemoglobin [HbA1c] > 10%) diabetes mellitus (type 1 or type 2) _28   Subject is receiving or has received lipoprotein apheresis to reduce Lp(a) within 3 months prior to enrollment. _29   Known uncontrolled or recurrent ventricular tachycardia in the past 3 months prior to enrollment. _30   Known malignancy (except non-melanoma skin cancers, cervical in situ carcinoma, breast ductal carcinoma in situ, or stage 1 prostate carcinoma) within the last 5 years  prior to enrollment. _31   Known history or evidence of clinically significant disease (eg, respiratory, gastrointestinal, or psychiatric disease) or unstable disorder or biomarker that, in the opinion of the investigator(s), would result in life expectancy < 5 years. _32   Known hemorrhagic stroke. _33    [employer] Lp(a) Informed Consent   Subject Name: John Wilkerson Subject met inclusion and exclusion criteria.  The informed consent form, study requirements and expectations were reviewed with the subject and questions and concerns were addressed prior to the signing of the consent form.  The subject verbalized understanding of the trial requirements.  The subject agreed to participate in the [facility] trial and signed the informed consent at 1345 on 10/03/20.  The informed consent was obtained prior to performance of any protocol-specific procedures for the subject.  A copy of the signed informed consent was given to the subject and a copy was placed in the subject's medical record.   Chanda Busing  [employer] Consent Version 2 Protocol Version 2

## 2020-10-04 LAB — LIPOPROTEIN A (LPA): Lipoprotein (a): 9.5 nmol/L (ref <75.0)

## 2020-10-08 NOTE — Progress Notes (Deleted)
Cardiology Office Note  Date: 10/08/2020   ID: John Wilkerson, DOB 1960-08-13, MRN [id number]  PCP:  Pcp, No  Cardiologist:  Rollene Rotunda, MD Electrophysiologist:  None   Chief Complaint: 3 month follow up  History of Present Illness: John Wilkerson is a 60 y.o. male with a history of HTN, STEMI Hypertensive heart disease, acute on chronic combined systolic and diastolic HF, USAP, Hiatal hernia, CKd, Tobacco user.  He last saw Dr Antoine Poche for [facility] visit 08/28/2020  after inferior STEMI. Cardiac catheterization showed 95% thrombotic mid to distal RCA stenosis. He had successful PCI. Had 70-80% LAD stenosis treated with staged procedure. Had severe LVH on Echo. His creatinine was 1.3. Since discharge he had a rash and presented to the ER. He was given hydroxyzine. This was thought to be a drug rash and may have been related to taking doxycycline. He admitted to some fatigue. He was not having the chest discomfort he was experiencing prior to PCI. He had stopped smoking and was using nicotine patches. He did no complain of DOE/SOB, PND or orthopnea. He was starting to walk some and increasing physical activity.   Is he  still having a rash ? May need to stop Marcelline Deist if so.  Did not get BMET ordered at last visit. Needs FLP and LFT study  Past Medical History:  Diagnosis Date   Hiatal hernia     Past Surgical History:  Procedure Laterality Date   CORONARY STENT INTERVENTION N/A 08/20/2020   Procedure: CORONARY STENT INTERVENTION;  Surgeon: Kathleene Hazel, MD;  [facility];  Service: Cardiovascular;  Laterality: N/A;   CORONARY/GRAFT ACUTE MI REVASCULARIZATION N/A 08/16/2020   Procedure: Coronary/Graft Acute MI Revascularization;  Surgeon: Yvonne Kendall, MD;  [facility]: [facility];  Service: Cardiovascular;  Laterality: N/A;   INTRAVASCULAR ULTRASOUND/IVUS N/A 08/20/2020   Procedure: Intravascular Ultrasound/IVUS;  Surgeon: Kathleene Hazel, MD;   [facility]: [facility];  Service: Cardiovascular;  Laterality: N/A;   LEFT HEART CATH AND CORONARY ANGIOGRAPHY N/A 08/16/2020   Procedure: LEFT HEART CATH AND CORONARY ANGIOGRAPHY;  Surgeon: Yvonne Kendall, MD;  [facility]: [facility];  Service: Cardiovascular;  Laterality: N/A;    Current Outpatient Medications  Medication Sig Dispense Refill   amLODipine (NORVASC) 10 MG tablet Take 1 tablet (10 mg total) by mouth daily. 30 tablet 11   aspirin 81 MG chewable tablet Chew 1 tablet (81 mg total) by mouth daily. 90 tablet 3   atorvastatin (LIPITOR) 80 MG tablet Take 1 tablet (80 mg total) by mouth daily. 30 tablet 11   carvedilol (COREG) 12.5 MG tablet Take 1 tablet (12.5 mg total) by mouth 2 (two) times daily with a meal. 60 tablet 11   empagliflozin (JARDIANCE) 10 MG TABS tablet Take 1 tablet (10 mg total) by mouth daily. 30 tablet 11   hydrOXYzine (ATARAX/VISTARIL) 25 MG tablet Take 1 tablet (25 mg total) by mouth every 6 (six) hours as needed for itching. 12 tablet 0   Multiple Vitamin (MULTIVITAMIN ADULT PO) Take 1 tablet by mouth daily.     nicotine (NICODERM CQ - DOSED IN MG/24 HOURS) 14 mg/24hr patch Place 1 patch (14 mg total) onto the skin daily. 28 patch 2   nitroGLYCERIN (NITROSTAT) 0.4 MG SL tablet Place 1 tablet (0.4 mg total) under the tongue every 5 (five) minutes x 3 doses as needed for chest pain. 100 tablet 1   Omega-3 Fatty Acids (FISH OIL OMEGA-3  PO) Take 1 tablet by mouth daily.     ticagrelor (BRILINTA) 90 MG TABS tablet Take 1 tablet (90 mg total) by mouth 2 (two) times daily. 60 tablet 11   Turmeric (QC TUMERIC COMPLEX PO) Take 1 tablet by mouth daily.     No current facility-administered medications for this visit.   Allergies:  Codeine and Doxycycline   Social History: The patient  reports that he has been smoking cigarettes. He has been smoking an average of 1 pack per day. He has never used smokeless tobacco. He reports previous alcohol use. He reports  previous drug use.   Family History: The patient's family history includes AAA (abdominal aortic aneurysm) in his father.   ROS:  Please see the history of present illness. Otherwise, complete review of systems is positive for {NONE DEFAULTED:18576}.  All other systems are reviewed and negative.   Physical Exam: VS:  There were no vitals taken for this visit., BMI There is no height or weight on file to calculate BMI.  Wt Readings from Last 3 Encounters:  08/28/20 212 lb (96.2 kg)  08/25/20 223 lb 15.8 oz (101.6 kg)  08/19/20 223 lb 15.8 oz (101.6 kg)    General: Patient appears comfortable at rest. HEENT: Conjunctiva and lids normal, oropharynx clear with moist mucosa. Neck: Supple, no elevated JVP or carotid bruits, no thyromegaly. Lungs: Clear to auscultation, nonlabored breathing at rest. Cardiac: Regular rate and rhythm, no S3 or significant systolic murmur, no pericardial rub. Abdomen: Soft, nontender, no hepatomegaly, bowel sounds present, no guarding or rebound. Extremities: No pitting edema, distal pulses 2+. Skin: Warm and dry. Musculoskeletal: No kyphosis. Neuropsychiatric: Alert and oriented x3, affect grossly appropriate.  ECG:  {EKG/Telemetry Strips Reviewed:[id number]}  Recent Labwork: 08/16/2020: ALT 25; AST 54; Magnesium 2.1 08/18/2020: B Natriuretic Peptide 851.5 08/21/2020: BUN 22; Creatinine, Ser 1.27; Hemoglobin 14.1; Platelets 265; Potassium 3.4; Sodium 137     Component Value Date/Time   CHOL 223 (H) 08/16/2020 0718   TRIG 180 (H) 08/16/2020 0718   HDL 34 (L) 08/16/2020 0718   CHOLHDL 6.6 08/16/2020 0718   VLDL 36 08/16/2020 0718   LDLCALC 153 (H) 08/16/2020 0718    Other Studies Reviewed Today:   Assessment and Plan:  1. CAD in native artery   2. Essential hypertension   3. Dyslipidemia   4. Rash      Medication Adjustments/Labs and Tests Ordered: Current medicines are reviewed at length with the patient today.  Concerns regarding medicines are  outlined above.   Disposition: Follow-up with ***  Signed, Rennis Harding, NP 10/08/2020 3:10 PM    [facility] at [facility] [address] [place], [place], [place] [address] Phone: [phone]; Fax: [phone]

## 2020-10-09 ENCOUNTER — Ambulatory Visit: Payer: Medicare Other | Admitting: Family Medicine

## 2020-10-09 DIAGNOSIS — I2510 Atherosclerotic heart disease of native coronary artery without angina pectoris: Secondary | ICD-10-CM

## 2020-10-09 DIAGNOSIS — R21 Rash and other nonspecific skin eruption: Secondary | ICD-10-CM

## 2020-10-09 DIAGNOSIS — E785 Hyperlipidemia, unspecified: Secondary | ICD-10-CM

## 2020-10-09 DIAGNOSIS — I10 Essential (primary) hypertension: Secondary | ICD-10-CM

## 2020-10-30 NOTE — Progress Notes (Signed)
Cardiology Office Note  Date: 10/31/2020   ID: John Wilkerson, DOB [birth year], MRN [id number]  PCP:  Pcp, No  Cardiologist:  Rollene Rotunda, MD Electrophysiologist:  None   Chief Complaint: 3 month follow up  History of Present Illness: John Wilkerson is a 60 y.o. male with a history of HTN, STEMI Hypertensive heart disease, acute on chronic combined systolic and diastolic HF, USAP, Hiatal hernia, CKD, Tobacco user.  He last saw Dr Antoine Poche for [facility] visit 08/28/2020  after inferior STEMI. Cardiac catheterization showed 95% thrombotic mid to distal RCA stenosis. He had successful PCI. Had 70-80% LAD stenosis treated with staged procedure. Had severe LVH on Echo. His creatinine was 1.3. Since discharge he had a rash and presented to the ER. He was given hydroxyzine. This was thought to be a drug rash and may have been related to taking doxycycline. He admitted to some fatigue. He was not having the chest discomfort he was experiencing prior to PCI. He had stopped smoking and was using nicotine patches. He did not complain of DOE/SOB, PND or orthopnea. He was starting to walk some and increasing physical activity.  He is here for 63-month follow-up.  He states he is slowly getting back to feeling normal.  He states he still has some shortness of breath with activity.  He continues on aspirin and Brilinta dual antiplatelet therapy.  He is continuing his carvedilol 12.5 mg p.o. twice daily, atorvastatin 80 mg p.o. daily, sublingual nitroglycerin as needed.  He states he has stopped smoking.  He states he plans to go through cardiac rehab.  Blood pressure is well controlled today at 130/70.  He is continuing amlodipine 10 mg daily.  He is on nicotine patches.  He states he did get his lab work done at [facility] which Dr. Antoine Poche had ordered at prior visit.  We do not see those studies in Care Everywhere.  Nursing staff will call to request results.  Patient states she is trying to eat better and has  lost some weight since discharge from hospital.  Currently denies any anginal symptoms.  No palpitations or arrhythmias, orthostatic symptoms, CVA or TIA-like symptoms.  States he continues to have some mild nosebleeding mostly seen on tissues and when he blows his nose.  Otherwise no bleeding in stool or urine.  Denies any PND or orthopnea.  Denies any claudication-like symptoms, DVT or PE-like symptoms, or lower extremity edema.  He states the rash he had a has cleared up.  He is continuing Jardiance 10 mg daily.    Past Medical History:  Diagnosis Date   Hiatal hernia     Past Surgical History:  Procedure Laterality Date   CORONARY STENT INTERVENTION N/A 08/20/2020   Procedure: CORONARY STENT INTERVENTION;  Surgeon: Kathleene Hazel, MD;  [facility];  Service: Cardiovascular;  Laterality: N/A;   CORONARY/GRAFT ACUTE MI REVASCULARIZATION N/A 08/16/2020   Procedure: Coronary/Graft Acute MI Revascularization;  Surgeon: Yvonne Kendall, MD;  [facility];  Service: Cardiovascular;  Laterality: N/A;   INTRAVASCULAR ULTRASOUND/IVUS N/A 08/20/2020   Procedure: Intravascular Ultrasound/IVUS;  Surgeon: Kathleene Hazel, MD;  [facility];  Service: Cardiovascular;  Laterality: N/A;   LEFT HEART CATH AND CORONARY ANGIOGRAPHY N/A 08/16/2020   Procedure: LEFT HEART CATH AND CORONARY ANGIOGRAPHY;  Surgeon: Yvonne Kendall, MD;  [facility];  Service: Cardiovascular;  Laterality: N/A;    Current Outpatient Medications  Medication Sig Dispense  Refill   amLODipine (NORVASC) 10 MG tablet Take 1 tablet (10 mg total) by mouth daily. 30 tablet 11   aspirin 81 MG chewable tablet Chew 1 tablet (81 mg total) by mouth daily. 90 tablet 3   atorvastatin (LIPITOR) 80 MG tablet Take 1 tablet (80 mg total) by mouth daily. 30 tablet 11   carvedilol (COREG) 12.5 MG tablet Take 1 tablet (12.5 mg total) by mouth 2 (two) times daily with a meal.  60 tablet 11   empagliflozin (JARDIANCE) 10 MG TABS tablet Take 1 tablet (10 mg total) by mouth daily. 30 tablet 11   Multiple Vitamin (MULTIVITAMIN ADULT PO) Take 1 tablet by mouth daily.     nitroGLYCERIN (NITROSTAT) 0.4 MG SL tablet Place 1 tablet (0.4 mg total) under the tongue every 5 (five) minutes x 3 doses as needed for chest pain. 100 tablet 1   Omega-3 Fatty Acids (FISH OIL OMEGA-3 PO) Take 1 tablet by mouth daily.     ticagrelor (BRILINTA) 90 MG TABS tablet Take 1 tablet (90 mg total) by mouth 2 (two) times daily. 60 tablet 11   Turmeric (QC TUMERIC COMPLEX PO) Take 1 tablet by mouth daily.     hydrOXYzine (ATARAX/VISTARIL) 25 MG tablet Take 1 tablet (25 mg total) by mouth every 6 (six) hours as needed for itching. (Patient not taking: Reported on 10/31/2020) 12 tablet 0   nicotine (NICODERM CQ - DOSED IN MG/24 HOURS) 14 mg/24hr patch Place 1 patch (14 mg total) onto the skin daily. (Patient not taking: Reported on 10/31/2020) 28 patch 2   No current facility-administered medications for this visit.   Allergies:  Codeine and Doxycycline   Social History: The patient  reports that he has quit smoking. His smoking use included cigarettes. He smoked an average of 1 pack per day. He has never used smokeless tobacco. He reports that he does not currently use alcohol. He reports that he does not currently use drugs.   Family History: The patient's family history includes AAA (abdominal aortic aneurysm) in his father.   ROS:  Please see the history of present illness. Otherwise, complete review of systems is positive for none.  All other systems are reviewed and negative.   Physical Exam: VS:  BP 130/70   Pulse 64   Ht 5\' 11"  (1.803 m)   Wt 213 lb 12.8 oz (97 kg)   SpO2 98%   BMI 29.82 kg/m , BMI Body mass index is 29.82 kg/m.  Wt Readings from Last 3 Encounters:  10/31/20 213 lb 12.8 oz (97 kg)  08/28/20 212 lb (96.2 kg)  08/25/20 223 lb 15.8 oz (101.6 kg)    General: Patient  appears comfortable at rest. HEENT: Conjunctiva and lids normal, oropharynx clear with moist mucosa. Neck: Supple, no elevated JVP or carotid bruits, no thyromegaly. Lungs: Clear to auscultation, nonlabored breathing at rest. Cardiac: Regular rate and rhythm, no S3 or significant systolic murmur, no pericardial rub. Abdomen: Soft, nontender, no hepatomegaly, bowel sounds present, no guarding or rebound. Extremities: No pitting edema, distal pulses 2+. Skin: Warm and dry. Musculoskeletal: No kyphosis. Neuropsychiatric: Alert and oriented x3, affect grossly appropriate.  ECG:    Recent Labwork: 08/16/2020: ALT 25; AST 54; Magnesium 2.1 08/18/2020: B Natriuretic Peptide 851.5 08/21/2020: BUN 22; Creatinine, Ser 1.27; Hemoglobin 14.1; Platelets 265; Potassium 3.4; Sodium 137     Component Value Date/Time   CHOL 223 (H) 08/16/2020 0718   TRIG 180 (H) 08/16/2020 0718   HDL 34 (L)  08/16/2020 0718   CHOLHDL 6.6 08/16/2020 0718   VLDL 36 08/16/2020 0718   LDLCALC 153 (H) 08/16/2020 0718    Other Studies Reviewed Today:  08/20/2020 Conclusion    Mid LAD-1 lesion is 75% stenosed. A drug-eluting stent was successfully placed using a STENT RESOLUTE ONYX 3.5X22. Post intervention, there is a 0% residual stenosis.   1. Severe mid LAD stenosis 2. Successful PTCA/DES x 1 mid LAD   Recommendations: Continue DAPT with ASA/Brilinta for 12 months  Diagnostic Dominance: Right Intervention         Renal artery duplex 08/18/2020 Summary: Renal: Mesenteric: Normal Celiac artery findings. 70 to 99% stenosis in the superior mesenteric artery. *See table(s) above for measurements and observations. Diagnosing physician: Heath Lark Electronically signed by Heath Lark on 08/18/2020 at 2:53:10 PM. Right: No evidence of right renal artery stenosis. Normal right Resisitive Index. RRV flow present. 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 Left: No evidence of  left renal artery stenosis. Normal left Resistive Index. LRV flow present.      Echocardiogram 08/16/2020  1. Left ventricular ejection fraction, by estimation, is 50 to 55%. The left ventricle has low normal function. The left ventricle has no regional wall motion abnormalities. There is severe left ventricular hypertrophy. Left ventricular diastolic parameters are indeterminate, but suggestive of restrictive diastolic filling pattern. 2. Right ventricular systolic function is normal. The right ventricular size is normal. Tricuspid regurgitation signal is inadequate for assessing PA pressure. 3. The mitral valve is grossly normal. Mild mitral valve regurgitation. 4. The aortic valve is tricuspid. Aortic valve regurgitation is not visualized. 5. The inferior vena cava is normal in size with greater than 50% respiratory variability, suggesting right atrial pressure of 3 mmHg.     Cardiac catheterization 08/16/2020 Coronary/Graft Acute MI Revascularization  LEFT HEART CATH AND CORONARY ANGIOGRAPHY   Conclusion  Conclusions: Multivessel coronary artery disease.  Culprit lesion for the patient's inferior STEMI is 95% thrombotic mid/distal RCA stenosis.  In addition, there is 70-80% mid LAD disease, 50% proximal LCx stenosis, and 60% distal RCA lesion extending to the bifurcation followed by moderate to severe RPDA and RPL disease. Severely elevated left ventricular filling pressure (LVEDP 35 mmHg). Successful PCI to mid/distal RCA using Resolute Onyx 3.5 x 18 mm drug-eluting stent (postdilated to 4.1 mm) with 0% residual stenosis and TIMI-3 flow. Brachioradial artery precluding advancement of 66F guide catheter.  Consider using alternative access for future catheterizations.   Recommendations: Dual antiplatelet therapy with aspirin and ticagrelor for at least 12 months. Continue tirofiban infusion for 4 hours. Obtain echocardiogram.  Aggressive secondary prevention, including high  intensity statin therapy and smoking cessation. Trend high-sensitivity troponin I until it has peaked, then stop. Check hemoglobin A1c. Anticipate staged PCI to LAD (could be performed this admission or shortly after discharge if patient is asymptomatic) based on hospital course.  Diagnostic Dominance: Right Intervention    Assessment and Plan:  1. CAD in native artery   2. Essential hypertension   3. Chronic combined systolic (congestive) and diastolic (congestive) heart failure (HCC)   4. Tobacco abuse   5. Mixed hyperlipidemia     1. CAD in native artery Recent stent placements on 08/16/2020 and 08/20/2020.  See cardiac cath report as above.  Currently denies any anginal symptoms.  Does state he has some occasional shortness of breath.  Continue aspirin 81 mg daily.  Continue Brilinta 90 mg p.o. twice daily.  Continue carvedilol 12.5 mg p.o. twice daily.  Continue sublingual nitroglycerin as needed.  2. Essential hypertension Blood pressure 130/70 today.  Continue carvedilol 12.5 mg p.o. twice daily.  Continue amlodipine 10 mg p.o. daily.  3. Chronic combined systolic (congestive) and diastolic (congestive) heart failure (HCC) Continues with some mild shortness of breath.  Weight is stable at 213.  Previous weight on 616 was 212.  States he has some mild shortness of breath with exertional activity. Echocardiogram on 08/16/2020 EF 50 to 55%.  No WMA's.  Severe LVH.  Indeterminate diastolic parameters but suggestive of restrictive diastolic filling pattern.  Mild MR.  Repeat echocardiogram in 1 month to reassess LV function, diastolic function and valvular function.  [facility]  4. Tobacco abuse Patient states he has stopped tobacco use.  He does have Nicotrol patches to use.  Continue Nicotrol patches and tobacco cessation.    5. Mixed hyperlipidemia Continue atorvastatin 80 mg p.o. daily.  Please get recent fasting lipid  profile and [facility].  Patient states this is where he had the lab work drawn.  Medication Adjustments/Labs and Tests Ordered: Current medicines are reviewed at length with the patient today.  Concerns regarding medicines are outlined above.   Disposition: Follow-up with Dr. Antoine PocheHochrein or APP  Signed, Rennis HardingAndrew Quinn, NP 10/31/2020 11:45 AM    [facility] [address], [place], [place] [address] Phone: [phone]; Fax: [phone]

## 2020-10-31 ENCOUNTER — Encounter: Payer: Self-pay | Admitting: Family Medicine

## 2020-10-31 ENCOUNTER — Ambulatory Visit (INDEPENDENT_AMBULATORY_CARE_PROVIDER_SITE_OTHER): Payer: No Typology Code available for payment source | Admitting: Family Medicine

## 2020-10-31 ENCOUNTER — Encounter: Payer: Self-pay | Admitting: *Deleted

## 2020-10-31 ENCOUNTER — Other Ambulatory Visit: Payer: Self-pay

## 2020-10-31 VITALS — BP 130/70 | HR 64 | Ht 71.0 in | Wt 213.8 lb

## 2020-10-31 DIAGNOSIS — Z720 Tobacco use: Secondary | ICD-10-CM | POA: Diagnosis not present

## 2020-10-31 DIAGNOSIS — I10 Essential (primary) hypertension: Secondary | ICD-10-CM

## 2020-10-31 DIAGNOSIS — R0602 Shortness of breath: Secondary | ICD-10-CM

## 2020-10-31 DIAGNOSIS — I2510 Atherosclerotic heart disease of native coronary artery without angina pectoris: Secondary | ICD-10-CM

## 2020-10-31 DIAGNOSIS — I5042 Chronic combined systolic (congestive) and diastolic (congestive) heart failure: Secondary | ICD-10-CM | POA: Diagnosis not present

## 2020-10-31 DIAGNOSIS — E782 Mixed hyperlipidemia: Secondary | ICD-10-CM

## 2020-10-31 NOTE — Patient Instructions (Addendum)

## 2020-12-29 ENCOUNTER — Encounter (HOSPITAL_COMMUNITY)
Admission: RE | Admit: 2020-12-29 | Discharge: 2020-12-29 | Disposition: A | Discharge status: Home / Self Care | Payer: No Typology Code available for payment source | Source: Ambulatory Visit | Attending: Internal Medicine | Admitting: Internal Medicine

## 2020-12-29 ENCOUNTER — Encounter (HOSPITAL_COMMUNITY): Payer: Self-pay

## 2020-12-29 ENCOUNTER — Other Ambulatory Visit: Payer: Self-pay

## 2020-12-29 VITALS — BP 126/64 | HR 64 | Ht 71.0 in | Wt 214.7 lb

## 2020-12-29 DIAGNOSIS — I2111 ST elevation (STEMI) myocardial infarction involving right coronary artery: Secondary | ICD-10-CM | POA: Diagnosis present

## 2020-12-29 DIAGNOSIS — Z955 Presence of coronary angioplasty implant and graft: Secondary | ICD-10-CM | POA: Insufficient documentation

## 2020-12-29 LAB — GLUCOSE, CAPILLARY: Glucose-Capillary: 102 mg/dL — ABNORMAL HIGH (ref 70–99)

## 2020-12-29 NOTE — Progress Notes (Signed)
Cardiac Individual Treatment Plan  Patient Details  Name: John Wilkerson MRN: [id number] Date of Birth: 08-03-1960 Referring Provider:   Flowsheet Row CARDIAC REHAB PHASE II ORIENTATION from 12/29/2020 in [facility] CARDIAC REHABILITATION  Referring Provider Dr. Okey Dupre       Initial Encounter Date:  Flowsheet Row CARDIAC REHAB PHASE II ORIENTATION from 12/29/2020 in [place] CARDIAC REHABILITATION  Date 12/29/20       Visit Diagnosis: ST elevation myocardial infarction involving right coronary artery [facility])  Status post coronary artery stent placement  Patient's Home Medications on Admission:  Current Outpatient Medications:    amLODipine (NORVASC) 10 MG tablet, Take 1 tablet (10 mg total) by mouth daily., Disp: 30 tablet, Rfl: 11   aspirin 81 MG chewable tablet, Chew 1 tablet (81 mg total) by mouth daily., Disp: 90 tablet, Rfl: 3   atorvastatin (LIPITOR) 80 MG tablet, Take 1 tablet (80 mg total) by mouth daily., Disp: 30 tablet, Rfl: 11   carvedilol (COREG) 12.5 MG tablet, Take 1 tablet (12.5 mg total) by mouth 2 (two) times daily with a meal., Disp: 60 tablet, Rfl: 11   empagliflozin (JARDIANCE) 10 MG TABS tablet, Take 1 tablet (10 mg total) by mouth daily., Disp: 30 tablet, Rfl: 11   escitalopram (LEXAPRO) 5 MG tablet, Take 5 mg by mouth daily., Disp: , Rfl:    Multiple Vitamin (MULTIVITAMIN ADULT PO), Take 1 tablet by mouth daily., Disp: , Rfl:    nitroGLYCERIN (NITROSTAT) 0.4 MG SL tablet, Place 1 tablet (0.4 mg total) under the tongue every 5 (five) minutes x 3 doses as needed for chest pain., Disp: 100 tablet, Rfl: 1   Omega-3 Fatty Acids (FISH OIL OMEGA-3 PO), Take 2 capsules by mouth daily., Disp: , Rfl:    ticagrelor (BRILINTA) 90 MG TABS tablet, Take 1 tablet (90 mg total) by mouth 2 (two) times daily., Disp: 60 tablet, Rfl: 11   Turmeric (QC TUMERIC COMPLEX PO), Take 1 tablet by mouth daily., Disp: , Rfl:   Past Medical History: Past Medical History:  Diagnosis Date    Hiatal hernia     Tobacco Use: Social History   Tobacco Use  Smoking Status Former   Packs/day: 1.00   Types: Cigarettes  Smokeless Tobacco Never    Labs: Recent Review Flowsheet Data     Labs for ITP Cardiac and Pulmonary Rehab Latest Ref Rng & Units 08/16/2020 08/16/2020 08/18/2020 08/18/2020   Cholestrol 0 - 200 mg/dL 409(W) - - -   LDLCALC 0 - 99 mg/dL 119(J) - - -   HDL >47 mg/dL 82(N) - - -   Trlycerides <150 mg/dL 562(Z) - - -   Hemoglobin A1c 4.8 - 5.6 % 5.6 5.6 - -   PHART 7.350 - 7.450 - - - 7.481(H)   PCO2ART 32.0 - 48.0 mmHg - - - 30.3(L)   HCO3 20.0 - 28.0 mmol/L - - 25.7 22.7   TCO2 22 - 32 mmol/L - - - 24   O2SAT % - - 41.1 93.0       Capillary Blood Glucose: Lab Results  Component Value Date   GLUCAP 102 (H) 12/29/2020   GLUCAP 107 (H) 08/16/2020    POCT Glucose     Row Name 12/29/20 1332             POCT Blood Glucose   Pre-Exercise 102 mg/dL                Exercise Target Goals: Exercise Program Goal: Individual  exercise prescription set using results from initial 6 min walk test and THRR while considering  patient's activity barriers and safety.   Exercise Prescription Goal: Starting with aerobic activity 30 plus minutes a day, 3 days per week for initial exercise prescription. Provide home exercise prescription and guidelines that participant acknowledges understanding prior to discharge.  Activity Barriers & Risk Stratification:  Activity Barriers & Cardiac Risk Stratification - 12/29/20 1300       Activity Barriers & Cardiac Risk Stratification   Activity Barriers Arthritis;Back Problems    Cardiac Risk Stratification High             6 Minute Walk:  6 Minute Walk     Row Name 12/29/20 1404         6 Minute Walk   Phase Initial     Distance 1250 feet     Walk Time 6 minutes     # of Rest Breaks 0     MPH 2.4     METS 3.04     RPE 14     VO2 Peak 10.65     Symptoms No     Resting HR 63 bpm     Resting BP  126/64     Resting Oxygen Saturation  96 %     Exercise Oxygen Saturation  during 6 min walk 96 %     Max Ex. HR 75 bpm     Max Ex. BP 145/75     2 Minute Post BP 130/70              Oxygen Initial Assessment:   Oxygen Re-Evaluation:   Oxygen Discharge (Final Oxygen Re-Evaluation):   Initial Exercise Prescription:  Initial Exercise Prescription - 12/29/20 1400       Date of Initial Exercise RX and Referring Provider   Date 12/29/20    Referring Provider Dr. Okey Dupre    Expected Discharge Date 03/23/21      Treadmill   MPH 1.7    Grade 0    Minutes 17      NuStep   Level 1    SPM 70    Minutes 22      Prescription Details   Frequency (times per week) 3    Duration Progress to 30 minutes of continuous aerobic without signs/symptoms of physical distress      Intensity   THRR 40-80% of Max Heartrate 64-127    Ratings of Perceived Exertion 11-15    Perceived Dyspnea 0-4      Resistance Training   Training Prescription Yes    Weight 3    Reps 10-15             Perform Capillary Blood Glucose checks as needed.  Exercise Prescription Changes:   Exercise Comments:   Exercise Goals and Review:   Exercise Goals     Row Name 12/29/20 1408             Exercise Goals   Increase Physical Activity Yes       Intervention Provide advice, education, support and counseling about physical activity/exercise needs.;Develop an individualized exercise prescription for aerobic and resistive training based on initial evaluation findings, risk stratification, comorbidities and participant's personal goals.       Expected Outcomes Short Term: Attend rehab on a regular basis to increase amount of physical activity.;Long Term: Add in home exercise to make exercise part of routine and to increase amount of physical activity.;Long Term: Exercising regularly at least 3-5  days a week.       Increase Strength and Stamina Yes       Intervention Provide advice, education,  support and counseling about physical activity/exercise needs.;Develop an individualized exercise prescription for aerobic and resistive training based on initial evaluation findings, risk stratification, comorbidities and participant's personal goals.       Expected Outcomes Short Term: Increase workloads from initial exercise prescription for resistance, speed, and METs.;Short Term: Perform resistance training exercises routinely during rehab and add in resistance training at home;Long Term: Improve cardiorespiratory fitness, muscular endurance and strength as measured by increased METs and functional capacity ( )       Able to understand and use rate of perceived exertion (RPE) scale Yes       Intervention Provide education and explanation on how to use RPE scale       Expected Outcomes Short Term: Able to use RPE daily in rehab to express subjective intensity level;Long Term:  Able to use RPE to guide intensity level when exercising independently       Knowledge and understanding of Target Heart Rate Range (THRR) Yes       Intervention Provide education and explanation of THRR including how the numbers were predicted and where they are located for reference       Expected Outcomes Short Term: Able to state/look up THRR;Short Term: Able to use daily as guideline for intensity in rehab;Long Term: Able to use THRR to govern intensity when exercising independently       Able to check pulse independently Yes       Intervention Provide education and demonstration on how to check pulse in carotid and radial arteries.;Review the importance of being able to check your own pulse for safety during independent exercise       Expected Outcomes Short Term: Able to explain why pulse checking is important during independent exercise;Long Term: Able to check pulse independently and accurately       Understanding of Exercise Prescription Yes       Intervention Provide education, explanation, and written materials  on patient's individual exercise prescription       Expected Outcomes Short Term: Able to explain program exercise prescription;Long Term: Able to explain home exercise prescription to exercise independently                Exercise Goals Re-Evaluation :    Discharge Exercise Prescription (Final Exercise Prescription Changes):   Nutrition:  Target Goals: Understanding of nutrition guidelines, daily intake of sodium 1500mg , cholesterol 200mg , calories 30% from fat and 7% or less from saturated fats, daily to have 5 or more servings of fruits and vegetables.  Biometrics:  Pre Biometrics - 12/29/20 1408       Pre Biometrics   Height 5\' 11"  (1.803 m)    Weight 214 lb 11.7 oz (97.4 kg)    Waist Circumference 43 inches    Hip Circumference 43 inches    Waist to Hip Ratio 1 %    BMI (Calculated) 29.96    Triceps Skinfold 11 mm    % Body Fat 28 %    Grip Strength 38.9 kg    Flexibility 4 in    Single Leg Stand 4 seconds              Nutrition Therapy Plan and Nutrition Goals:   Nutrition Assessments:  Nutrition Assessments - 12/29/20 1304       MEDFICTS Scores   Pre Score 25  MEDIFICTS Score Key: ?70 Need to make dietary changes  40-70 Heart Healthy Diet ? 40 Therapeutic Level Cholesterol Diet   Picture Your Plate Scores: <86 Unhealthy dietary pattern with much room for improvement. 41-50 Dietary pattern unlikely to meet recommendations for good health and room for improvement. 51-60 More healthful dietary pattern, with some room for improvement.  >60 Healthy dietary pattern, although there may be some specific behaviors that could be improved.    Nutrition Goals Re-Evaluation:   Nutrition Goals Discharge (Final Nutrition Goals Re-Evaluation):   Psychosocial: Target Goals: Acknowledge presence or absence of significant depression and/or stress, maximize coping skills, provide positive support system. Participant is able to verbalize  types and ability to use techniques and skills needed for reducing stress and depression.  Initial Review & Psychosocial Screening:  Initial Psych Review & Screening - 12/29/20 1301       Initial Review   Current issues with Current Anxiety/Panic   escitalopram recently increased to 10 mg daily     Family Dynamics   Good Support System? Yes    Comments His support system includes his three children and his wife.      Barriers   Psychosocial barriers to participate in program There are no identifiable barriers or psychosocial needs.;The patient should benefit from training in stress management and relaxation.      Screening Interventions   Interventions Encouraged to exercise    Expected Outcomes Long Term Goal: Stressors or current issues are controlled or eliminated.;Short Term goal: Identification and review with participant of any Quality of Life or Depression concerns found by scoring the questionnaire.;Long Term goal: The participant improves quality of Life and PHQ9 Scores as seen by post scores and/or verbalization of changes             Quality of Life Scores:  Quality of Life - 12/29/20 1409       Quality of Life   Select Quality of Life      Quality of Life Scores   Health/Function Pre 22 %    Socioeconomic Pre 18 %    Psych/Spiritual Pre 16.36 %    Family Pre 27.6 %    GLOBAL Pre 20.84 %            Scores of 19 and below usually indicate a poorer quality of life in these areas.  A difference of  2-3 points is a clinically meaningful difference.  A difference of 2-3 points in the total score of the Quality of Life Index has been associated with significant improvement in overall quality of life, self-image, physical symptoms, and general health in studies assessing change in quality of life.  PHQ-9: Recent Review Flowsheet Data     Depression screen [facility] 2/9 12/29/2020   Decreased Interest 0   Down, Depressed, Hopeless 0   PHQ - 2 Score 0   Altered  sleeping 1   Tired, decreased energy 2   Change in appetite 0   Feeling bad or failure about yourself  1   Trouble concentrating 0   Moving slowly or fidgety/restless 1   Suicidal thoughts 0   PHQ-9 Score 5   Difficult doing work/chores Not difficult at all      Interpretation of Total Score  Total Score Depression Severity:  1-4 = Minimal depression, 5-9 = Mild depression, 10-14 = Moderate depression, 15-19 = Moderately severe depression, 20-27 = Severe depression   Psychosocial Evaluation and Intervention:  Psychosocial Evaluation - 12/29/20 1344  Psychosocial Evaluation & Interventions   Interventions Encouraged to exercise with the program and follow exercise prescription    Comments Pt has no barriers to completing rehab. Pt has no identifiable psychosocial issues. He does take lexapro which helps him with anxiety and sleep. He biggest issue right now is his lack of energy since his STEMI and stents. He reports that he has a good support system with his wife and his children and copes well with his problems. He has lost over 20 lbs since his was in the hospital by cleaning up his diet. He hopes to lose about 10 more lbs while in the program in order to weight less than 200 lbs. His other goals include gaining his strength and stamina back. He is eager to start the program.    Expected Outcomes Pt will continue to take lexapro for anxiety and will not have any other identifiable psychosocial issues.    Continue Psychosocial Services  No Follow up required             Psychosocial Re-Evaluation:   Psychosocial Discharge (Final Psychosocial Re-Evaluation):   Vocational Rehabilitation: Provide vocational rehab assistance to qualifying candidates.   Vocational Rehab Evaluation & Intervention:  Vocational Rehab - 12/29/20 1318       Initial Vocational Rehab Evaluation & Intervention   Assessment shows need for Vocational Rehabilitation No              Education: Education Goals: Education classes will be provided on a weekly basis, covering required topics. Participant will state understanding/return demonstration of topics presented.  Learning Barriers/Preferences:  Learning Barriers/Preferences - 12/29/20 1315       Learning Barriers/Preferences   Learning Barriers None    Learning Preferences Audio;Computer/Internet;Group Instruction;Skilled Demonstration;Pictoral;Individual Instruction;Verbal Instruction;Video;Written Material             Education Topics: Hypertension, Hypertension Reduction -Define heart disease and high blood pressure. Discus how high blood pressure affects the body and ways to reduce high blood pressure.   Exercise and Your Heart -Discuss why it is important to exercise, the FITT principles of exercise, normal and abnormal responses to exercise, and how to exercise safely.   Angina -Discuss definition of angina, causes of angina, treatment of angina, and how to decrease risk of having angina.   Cardiac Medications -Review what the following cardiac medications are used for, how they affect the body, and side effects that may occur when taking the medications.  Medications include Aspirin, Beta blockers, calcium channel blockers, ACE Inhibitors, angiotensin receptor blockers, diuretics, digoxin, and antihyperlipidemics.   Congestive Heart Failure -Discuss the definition of CHF, how to live with CHF, the signs and symptoms of CHF, and how keep track of weight and sodium intake.   Heart Disease and Intimacy -Discus the effect sexual activity has on the heart, how changes occur during intimacy as we age, and safety during sexual activity.   Smoking Cessation / COPD -Discuss different methods to quit smoking, the health benefits of quitting smoking, and the definition of COPD.   Nutrition I: Fats -Discuss the types of cholesterol, what cholesterol does to the heart, and how cholesterol  levels can be controlled.   Nutrition II: Labels -Discuss the different components of food labels and how to read food label   Heart Parts/Heart Disease and PAD -Discuss the anatomy of the heart, the pathway of blood circulation through the heart, and these are affected by heart disease.   Stress I: Signs and Symptoms -Discuss the causes of  stress, how stress may lead to anxiety and depression, and ways to limit stress.   Stress II: Relaxation -Discuss different types of relaxation techniques to limit stress.   Warning Signs of Stroke / TIA -Discuss definition of a stroke, what the signs and symptoms are of a stroke, and how to identify when someone is having stroke.   Knowledge Questionnaire Score:  Knowledge Questionnaire Score - 12/29/20 1315       Knowledge Questionnaire Score   Pre Score 23/28             Core Components/Risk Factors/Patient Goals at Admission:  Personal Goals and Risk Factors at Admission - 12/29/20 1318       Core Components/Risk Factors/Patient Goals on Admission    Weight Management Yes;Obesity;Weight Loss    Intervention Weight Management: Develop a combined nutrition and exercise program designed to reach desired caloric intake, while maintaining appropriate intake of nutrient and fiber, sodium and fats, and appropriate energy expenditure required for the weight goal.;Weight Management: Provide education and appropriate resources to help participant work on and attain dietary goals.;Weight Management/Obesity: Establish reasonable short term and long term weight goals.;Obesity: Provide education and appropriate resources to help participant work on and attain dietary goals.    Admit Weight 210 lb (95.3 kg)    Goal Weight: Short Term 200 lb (90.7 kg)    Expected Outcomes Short Term: Continue to assess and modify interventions until short term weight is achieved;Long Term: Adherence to nutrition and physical activity/exercise program aimed toward  attainment of established weight goal;Weight Maintenance: Understanding of the daily nutrition guidelines, which includes 25-35% calories from fat, 7% or less cal from saturated fats, less than 200mg  cholesterol, less than 1.5gm of sodium, & 5 or more servings of fruits and vegetables daily;Weight Loss: Understanding of general recommendations for a balanced deficit meal plan, which promotes 1-2 lb weight loss per week and includes a negative energy balance of [id number] kcal/d;Understanding recommendations for meals to include 15-35% energy as protein, 25-35% energy from fat, 35-60% energy from carbohydrates, less than 200mg  of dietary cholesterol, 20-35 gm of total fiber daily;Understanding of distribution of calorie intake throughout the day with the consumption of 4-5 meals/snacks    Improve shortness of breath with ADL's Yes    Intervention Provide education, individualized exercise plan and daily activity instruction to help decrease symptoms of SOB with activities of daily living.    Expected Outcomes Short Term: Improve cardiorespiratory fitness to achieve a reduction of symptoms when performing ADLs;Long Term: Be able to perform more ADLs without symptoms or delay the onset of symptoms             Core Components/Risk Factors/Patient Goals Review:    Core Components/Risk Factors/Patient Goals at Discharge (Final Review):    ITP Comments:   Comments: Patient arrived for 1st visit/orientation/education at 1230. Patient was referred to CR by Dr. due to STEMI involving right coronary artery (I21.11) and status post coronary artery stent placement (Z95.5). During orientation advised patient on arrival and appointment times what to wear, what to do before, during and after exercise. Reviewed attendance and class policy.  Pt is scheduled to return Cardiac Rehab on 12/31/2020 at 1445. Pt was advised to come to class 15 minutes before class starts.  Discussed RPE/Dpysnea scales. Patient  participated in warm up stretches. Patient was able to complete 6 minute walk test.  Telemetry:NSR. Patient was measured for the equipment. Discussed equipment safety with patient. Took patient pre-anthropometric measurements. Patient finished visit  at 1400.

## 2020-12-31 ENCOUNTER — Encounter (HOSPITAL_COMMUNITY)
Admission: RE | Admit: 2020-12-31 | Discharge: 2020-12-31 | Disposition: A | Discharge status: Home / Self Care | Payer: No Typology Code available for payment source | Source: Ambulatory Visit | Attending: Cardiology | Admitting: Cardiology

## 2020-12-31 ENCOUNTER — Encounter (HOSPITAL_COMMUNITY): Payer: No Typology Code available for payment source

## 2020-12-31 DIAGNOSIS — I2111 ST elevation (STEMI) myocardial infarction involving right coronary artery: Secondary | ICD-10-CM | POA: Diagnosis not present

## 2020-12-31 DIAGNOSIS — Z955 Presence of coronary angioplasty implant and graft: Secondary | ICD-10-CM

## 2020-12-31 NOTE — Progress Notes (Signed)
Daily Session Note  Patient Details  Name: John Wilkerson MRN: [id number] Date of Birth: [birth year] Referring Provider:   Flowsheet Row CARDIAC REHAB PHASE II ORIENTATION from 12/29/2020 in [place]  Referring Provider Dr. Saunders Revel       Encounter Date: 12/31/2020  Check In:  Session Check In - 12/31/20 1100       Check-In   Supervising physician immediately available to respond to emergencies [facility] MD immediately available    Physician(s) Dr. Domenic Polite    Location [facility]    Staff Present Hoy Register, MS, ACSM-CEP, Exercise Physiologist;Anthonee Gelin Otho Ket, BS, Exercise Physiologist;Debra Wynetta Emery, RN, BSN    Virtual Visit No    Medication changes reported     No    Fall or balance concerns reported    No    Tobacco Cessation No Change    Warm-up and Cool-down Not performed (comment)   CBG 88 departmental guidlines could not exercsie until increased   Resistance Training Performed No    VAD Patient? No    PAD/SET Patient? No      Pain Assessment   Currently in Pain? No/denies    Multiple Pain Sites No             Capillary Blood Glucose: No results found for this or any previous visit (from the past 24 hour(s)).    Social History   Tobacco Use  Smoking Status Former   Packs/day: 1.00   Types: Cigarettes  Smokeless Tobacco Never    Goals Met:  Independence with exercise equipment Exercise tolerated well No report of concerns or symptoms today Strength training completed today  Goals Unmet:  Not Applicable  Comments: check out 1200   Dr. Kathie Dike is Medical Director for [facility] Pul[facility].

## 2021-01-01 LAB — GLUCOSE, CAPILLARY
Glucose-Capillary: 88 mg/dL (ref 70–99)
Glucose-Capillary: 97 mg/dL (ref 70–99)

## 2021-01-02 ENCOUNTER — Encounter (HOSPITAL_COMMUNITY): Payer: No Typology Code available for payment source

## 2021-01-02 ENCOUNTER — Encounter (HOSPITAL_COMMUNITY)
Admission: RE | Admit: 2021-01-02 | Discharge: 2021-01-02 | Disposition: A | Discharge status: Home / Self Care | Payer: No Typology Code available for payment source | Source: Ambulatory Visit | Attending: Cardiology | Admitting: Cardiology

## 2021-01-02 ENCOUNTER — Other Ambulatory Visit: Payer: Self-pay

## 2021-01-02 DIAGNOSIS — Z955 Presence of coronary angioplasty implant and graft: Secondary | ICD-10-CM

## 2021-01-02 DIAGNOSIS — I2111 ST elevation (STEMI) myocardial infarction involving right coronary artery: Secondary | ICD-10-CM | POA: Diagnosis not present

## 2021-01-02 LAB — GLUCOSE, CAPILLARY: Glucose-Capillary: 138 mg/dL — ABNORMAL HIGH (ref 70–99)

## 2021-01-02 NOTE — Progress Notes (Signed)
Daily Session Note  Patient Details  Name: John Wilkerson MRN: [id number] Date of Birth: 08/28/1960 Referring Provider:   Flowsheet Row CARDIAC REHAB PHASE II ORIENTATION from 12/29/2020 in [place]  Referring Provider Dr. Saunders Revel       Encounter Date: 01/02/2021  Check In:  Session Check In - 01/02/21 1100       Check-In   Supervising physician immediately available to respond to emergencies [facility] MD immediately available    Physician(s) Dr. Domenic Polite    Location [facility]    Staff Present Hoy Register, MS, ACSM-CEP, Exercise Physiologist;Kailene Steinhart Zigmund Daniel, Exercise Physiologist    Virtual Visit No    Medication changes reported     No    Fall or balance concerns reported    No    Tobacco Cessation No Change    Warm-up and Cool-down Performed as group-led instruction    Resistance Training Performed Yes    VAD Patient? No    PAD/SET Patient? No      Pain Assessment   Currently in Pain? No/denies    Multiple Pain Sites No             Capillary Blood Glucose: Results for orders placed or performed during the hospital encounter of 01/02/21 (from the past 24 hour(s))  Glucose, capillary     Status: Abnormal   Collection Time: 01/02/21 10:49 AM  Result Value Ref Range   Glucose-Capillary 138 (H) 70 - 99 mg/dL      Social History   Tobacco Use  Smoking Status Former   Packs/day: 1.00   Types: Cigarettes  Smokeless Tobacco Never    Goals Met:  Independence with exercise equipment Exercise tolerated well No report of concerns or symptoms today Strength training completed today  Goals Unmet:  Not Applicable  Comments: check out 1200   Dr. Kathie Dike is Medical Director for [place].

## 2021-01-05 ENCOUNTER — Encounter (HOSPITAL_COMMUNITY)
Admission: RE | Admit: 2021-01-05 | Discharge: 2021-01-05 | Disposition: A | Discharge status: Home / Self Care | Payer: No Typology Code available for payment source | Source: Ambulatory Visit | Attending: Cardiology | Admitting: Cardiology

## 2021-01-05 ENCOUNTER — Encounter (HOSPITAL_COMMUNITY): Payer: No Typology Code available for payment source

## 2021-01-05 VITALS — Wt 218.0 lb

## 2021-01-05 DIAGNOSIS — I2111 ST elevation (STEMI) myocardial infarction involving right coronary artery: Secondary | ICD-10-CM

## 2021-01-05 DIAGNOSIS — Z955 Presence of coronary angioplasty implant and graft: Secondary | ICD-10-CM

## 2021-01-05 LAB — GLUCOSE, CAPILLARY: Glucose-Capillary: 117 mg/dL — ABNORMAL HIGH (ref 70–99)

## 2021-01-05 NOTE — Progress Notes (Signed)
Daily Session Note  Patient Details  Name: John Wilkerson MRN: [id number] Date of Birth: May 30, 1960 Referring Provider:   Flowsheet Row CARDIAC REHAB PHASE II ORIENTATION from 12/29/2020 in [place]  Referring Provider Dr. Saunders Revel       Encounter Date: 01/05/2021  Check In:  Session Check In - 01/05/21 1100       Check-In   Supervising physician immediately available to respond to emergencies [facility] MD immediately available    Physician(s) Dr. Harrington Challenger    Location [facility]    Staff Present Geanie Cooley, RN;Dalton Fletcher, MS, ACSM-CEP, Exercise Physiologist    Virtual Visit No    Medication changes reported     No    Fall or balance concerns reported    No    Tobacco Cessation No Change    Warm-up and Cool-down Performed as group-led instruction    Resistance Training Performed Yes    VAD Patient? No    PAD/SET Patient? No      Pain Assessment   Currently in Pain? No/denies    Multiple Pain Sites No             Capillary Blood Glucose: No results found for this or any previous visit (from the past 24 hour(s)).    Social History   Tobacco Use  Smoking Status Former   Packs/day: 1.00   Types: Cigarettes  Smokeless Tobacco Never    Goals Met:  Independence with exercise equipment Exercise tolerated well No report of concerns or symptoms today Strength training completed today  Goals Unmet:  Not Applicable  Comments: check out at 12:00pm    Dr. Kathie Dike is Medical Director for [facility] Pulmonary Rehab.

## 2021-01-07 ENCOUNTER — Encounter (HOSPITAL_COMMUNITY)
Admission: RE | Admit: 2021-01-07 | Discharge: 2021-01-07 | Disposition: A | Discharge status: Home / Self Care | Payer: No Typology Code available for payment source | Source: Ambulatory Visit | Attending: Cardiology | Admitting: Cardiology

## 2021-01-07 ENCOUNTER — Encounter (HOSPITAL_COMMUNITY): Payer: No Typology Code available for payment source

## 2021-01-07 DIAGNOSIS — I2111 ST elevation (STEMI) myocardial infarction involving right coronary artery: Secondary | ICD-10-CM | POA: Diagnosis not present

## 2021-01-07 DIAGNOSIS — Z955 Presence of coronary angioplasty implant and graft: Secondary | ICD-10-CM

## 2021-01-07 NOTE — Progress Notes (Signed)
Daily Session Note  Patient Details  Name: John Wilkerson MRN: [id number] Date of Birth: 1960/03/22 Referring Provider:   Flowsheet Row CARDIAC REHAB PHASE II ORIENTATION from 12/29/2020 in [place]  Referring Provider Dr. Saunders Revel       Encounter Date: 01/07/2021  Check In:  Session Check In - 01/07/21 1100       Check-In   Supervising physician immediately available to respond to emergencies [facility] immediately available    Physician(s) Dr. Harl Bowie    Location [facility]    Staff Present Hoy Register, MS, ACSM-CEP, Exercise Physiologist;Miyah Hampshire Zigmund Daniel, Exercise Physiologist    Virtual Visit No    Medication changes reported     No    Fall or balance concerns reported    No    Tobacco Cessation No Change    Warm-up and Cool-down Performed as group-led instruction    Resistance Training Performed Yes    VAD Patient? No    PAD/SET Patient? No      Pain Assessment   Currently in Pain? No/denies    Multiple Pain Sites No             Capillary Blood Glucose: No results found for this or any previous visit (from the past 24 hour(s)).    Social History   Tobacco Use  Smoking Status Former   Packs/day: 1.00   Types: Cigarettes  Smokeless Tobacco Never    Goals Met:  Independence with exercise equipment Exercise tolerated well No report of concerns or symptoms today Strength training completed today  Goals Unmet:  Not Applicable  Comments: check out 1200   Dr. Kathie Dike is Medical Director for [facility] Pulmonary Rehab.

## 2021-01-08 ENCOUNTER — Ambulatory Visit (INDEPENDENT_AMBULATORY_CARE_PROVIDER_SITE_OTHER): Payer: No Typology Code available for payment source

## 2021-01-08 ENCOUNTER — Other Ambulatory Visit: Payer: Self-pay

## 2021-01-08 DIAGNOSIS — R0602 Shortness of breath: Secondary | ICD-10-CM | POA: Diagnosis not present

## 2021-01-08 LAB — ECHOCARDIOGRAM COMPLETE
Area-P 1/2: 2.21 cm2
Calc EF: 55.8 %
S' Lateral: 3.83 cm
Single Plane A2C EF: 47.1 %
Single Plane A4C EF: 60.5 %

## 2021-01-09 ENCOUNTER — Encounter (HOSPITAL_COMMUNITY)
Admission: RE | Admit: 2021-01-09 | Discharge: 2021-01-09 | Disposition: A | Discharge status: Home / Self Care | Payer: No Typology Code available for payment source | Source: Ambulatory Visit | Attending: Cardiology | Admitting: Cardiology

## 2021-01-09 ENCOUNTER — Encounter (HOSPITAL_COMMUNITY): Payer: No Typology Code available for payment source

## 2021-01-09 DIAGNOSIS — I2111 ST elevation (STEMI) myocardial infarction involving right coronary artery: Secondary | ICD-10-CM | POA: Diagnosis not present

## 2021-01-09 DIAGNOSIS — Z955 Presence of coronary angioplasty implant and graft: Secondary | ICD-10-CM

## 2021-01-09 NOTE — Progress Notes (Signed)
Daily Session Note  Patient Details  Name: John Wilkerson MRN: [id number] Date of Birth: 10-26-1960 Referring Provider:   Flowsheet Row CARDIAC REHAB PHASE II ORIENTATION from 12/29/2020 in [place]  Referring Provider Dr. Saunders Revel       Encounter Date: 01/09/2021  Check In:  Session Check In - 01/09/21 1100       Check-In   Supervising physician immediately available to respond to emergencies [facility] immediately available    Physician(s) Dr. Harl Bowie    Location [facility]    Staff Present Hoy Register, MS, ACSM-CEP, Exercise Physiologist;Other    Virtual Visit No    Medication changes reported     No    Fall or balance concerns reported    No    Tobacco Cessation No Change    Warm-up and Cool-down Performed as group-led instruction    Resistance Training Performed Yes    VAD Patient? No    PAD/SET Patient? No      Pain Assessment   Currently in Pain? No/denies    Multiple Pain Sites No             Capillary Blood Glucose: No results found for this or any previous visit (from the past 24 hour(s)).    Social History   Tobacco Use  Smoking Status Former   Packs/day: 1.00   Types: Cigarettes  Smokeless Tobacco Never    Goals Met:  Independence with exercise equipment Exercise tolerated well No report of concerns or symptoms today Strength training completed today  Goals Unmet:  Not Applicable  Comments: checkout time is 1200   Dr. Kathie Dike is Medical Director for [facility] Pulmonary Rehab.

## 2021-01-12 ENCOUNTER — Other Ambulatory Visit: Payer: Self-pay

## 2021-01-12 ENCOUNTER — Encounter (HOSPITAL_COMMUNITY)
Admission: RE | Admit: 2021-01-12 | Discharge: 2021-01-12 | Disposition: A | Discharge status: Home / Self Care | Payer: No Typology Code available for payment source | Source: Ambulatory Visit | Attending: Cardiology | Admitting: Cardiology

## 2021-01-12 ENCOUNTER — Encounter (HOSPITAL_COMMUNITY): Payer: No Typology Code available for payment source

## 2021-01-12 DIAGNOSIS — I2111 ST elevation (STEMI) myocardial infarction involving right coronary artery: Secondary | ICD-10-CM

## 2021-01-12 DIAGNOSIS — Z955 Presence of coronary angioplasty implant and graft: Secondary | ICD-10-CM

## 2021-01-12 NOTE — Progress Notes (Signed)
Daily Session Note  Patient Details  Name: John Wilkerson MRN: [id number] Date of Birth: Sep 24, 1960 Referring Provider:   Flowsheet Row CARDIAC REHAB PHASE II ORIENTATION from 12/29/2020 in [place]  Referring Provider Dr. Saunders Revel       Encounter Date: 01/12/2021  Check In:  Session Check In - 01/12/21 1100       Check-In   Supervising physician immediately available to respond to emergencies [facility] immediately available    Physician(s) Dr. Domenic Polite    Location [facility]    Staff Present Hoy Register, MS, ACSM-CEP, Exercise Physiologist;Debra Wynetta Emery, RN, BSN    Virtual Visit No    Medication changes reported     No    Fall or balance concerns reported    No    Tobacco Cessation No Change    Warm-up and Cool-down Performed as group-led instruction    Resistance Training Performed Yes    VAD Patient? No    PAD/SET Patient? No      Pain Assessment   Currently in Pain? No/denies    Multiple Pain Sites No             Capillary Blood Glucose: No results found for this or any previous visit (from the past 24 hour(s)).    Social History   Tobacco Use  Smoking Status Former   Packs/day: 1.00   Types: Cigarettes  Smokeless Tobacco Never    Goals Met:  Independence with exercise equipment Exercise tolerated well No report of concerns or symptoms today Strength training completed today  Goals Unmet:  Not Applicable  Comments: checkout time is 1200   Dr. Kathie Dike is Medical Director for [facility] Pulmonary Rehab.

## 2021-01-14 ENCOUNTER — Encounter (HOSPITAL_COMMUNITY)
Admission: RE | Admit: 2021-01-14 | Discharge: 2021-01-14 | Disposition: A | Discharge status: Home / Self Care | Payer: No Typology Code available for payment source | Source: Ambulatory Visit | Attending: Cardiology | Admitting: Cardiology

## 2021-01-14 ENCOUNTER — Encounter (HOSPITAL_COMMUNITY): Payer: No Typology Code available for payment source

## 2021-01-14 DIAGNOSIS — I2111 ST elevation (STEMI) myocardial infarction involving right coronary artery: Secondary | ICD-10-CM | POA: Insufficient documentation

## 2021-01-14 DIAGNOSIS — Z955 Presence of coronary angioplasty implant and graft: Secondary | ICD-10-CM | POA: Insufficient documentation

## 2021-01-14 NOTE — Progress Notes (Signed)
Daily Session Note  Patient Details  Name: John Wilkerson MRN: [id number] Date of Birth: 03/05/1961 Referring Provider:   Flowsheet Row CARDIAC REHAB PHASE II ORIENTATION from 12/29/2020 in [place]  Referring Provider Dr. Saunders Revel       Encounter Date: 01/14/2021  Check In:  Session Check In - 01/14/21 1100       Check-In   Supervising physician immediately available to respond to emergencies [facility] immediately available    Physician(s) Dr. Marisue Ivan    Location [facility]    Staff Present Hoy Register, MS, ACSM-CEP, Exercise Physiologist;Kaysen Sefcik Wynetta Emery, RN, BSN    Virtual Visit No    Medication changes reported     No    Fall or balance concerns reported    No    Tobacco Cessation No Change    Warm-up and Cool-down Performed as group-led instruction    Resistance Training Performed Yes    VAD Patient? No    PAD/SET Patient? No      Pain Assessment   Currently in Pain? No/denies    Multiple Pain Sites No             Capillary Blood Glucose: No results found for this or any previous visit (from the past 24 hour(s)).    Social History   Tobacco Use  Smoking Status Former   Packs/day: 1.00   Types: Cigarettes  Smokeless Tobacco Never    Goals Met:  Independence with exercise equipment Exercise tolerated well No report of concerns or symptoms today Strength training completed today  Goals Unmet:  Not Applicable  Comments: Check out 1200.   Dr. Kathie Dike is Medical Director for [facility] Pulmonary Rehab.

## 2021-01-16 ENCOUNTER — Encounter (HOSPITAL_COMMUNITY): Payer: No Typology Code available for payment source

## 2021-01-16 ENCOUNTER — Encounter (HOSPITAL_COMMUNITY)
Admission: RE | Admit: 2021-01-16 | Discharge: 2021-01-16 | Disposition: A | Discharge status: Home / Self Care | Payer: No Typology Code available for payment source | Source: Ambulatory Visit | Attending: Cardiology | Admitting: Cardiology

## 2021-01-16 ENCOUNTER — Telehealth: Payer: Self-pay | Admitting: Family Medicine

## 2021-01-16 DIAGNOSIS — Z955 Presence of coronary angioplasty implant and graft: Secondary | ICD-10-CM

## 2021-01-16 DIAGNOSIS — I2111 ST elevation (STEMI) myocardial infarction involving right coronary artery: Secondary | ICD-10-CM | POA: Diagnosis not present

## 2021-01-16 NOTE — Telephone Encounter (Signed)
Lesle Chris, LPN  46/04/7033  2:19 PM EDT Back to Top    Notified, copy to pcp.    Roseanne Reno, CMA  01/09/2021  8:14 AM EDT     Attempted patient contact with no answer/no voicemail box    Netta Neat., NP  01/08/2021 10:40 PM EDT     Please call the patient and let him know the echocardiogram showed pumping function is in the low normal range at 55%.  The main pumping chamber is moderately muscular and stiff.  Key to managing is to keep blood pressure at or below 130/80 and manage all other risk factors.  He has a mildly leaking mitral valve Netta Neat, NP  01/08/2021 10:38 PM

## 2021-01-16 NOTE — Progress Notes (Signed)
I have reviewed a Home Exercise Prescription with John Wilkerson . John Wilkerson is  currently exercising at home.  The patient was advised to walk 2-4 days a week for 30-45 minutes.  John Wilkerson and I discussed how to progress their exercise prescription.  The patient stated that their goals were lose weight, increase stamina, and to increase flexibility.  The patient stated that they understand the exercise prescription.  We reviewed exercise guidelines, target heart rate during exercise, RPE Scale, weather conditions, NTG use, endpoints for exercise, warmup and cool down.  Patient is encouraged to come to me with any questions. I will continue to follow up with the patient to assist them with progression and safety.

## 2021-01-16 NOTE — Progress Notes (Signed)
Daily Session Note  Patient Details  Name: John Wilkerson MRN: [id number] Date of Birth: 26-Feb-1961 Referring Provider:   Flowsheet Row CARDIAC REHAB PHASE II ORIENTATION from 12/29/2020 in [place]  Referring Provider Dr. Saunders Revel       Encounter Date: 01/16/2021  Check In:  Session Check In - 01/16/21 1100       Check-In   Supervising physician immediately available to respond to emergencies [facility] immediately available    Physician(s) Dr. Domenic Polite    Location [facility]    Staff Present Hoy Register, MS, ACSM-CEP, Exercise Physiologist;Debra Wynetta Emery, RN, BSN    Virtual Visit No    Medication changes reported     No    Fall or balance concerns reported    No    Tobacco Cessation No Change    Warm-up and Cool-down Performed as group-led instruction    Resistance Training Performed Yes    VAD Patient? No    PAD/SET Patient? No      Pain Assessment   Currently in Pain? No/denies    Multiple Pain Sites No             Capillary Blood Glucose: No results found for this or any previous visit (from the past 24 hour(s)).    Social History   Tobacco Use  Smoking Status Former   Packs/day: 1.00   Types: Cigarettes  Smokeless Tobacco Never    Goals Met:  Independence with exercise equipment Exercise tolerated well No report of concerns or symptoms today Strength training completed today  Goals Unmet:  Not Applicable  Comments: checkout time is 1200   Dr. Kathie Dike is Medical Director for [facility] Pulmonary Rehab.

## 2021-01-16 NOTE — Telephone Encounter (Signed)
Patient returning call for echo results. Phone: [phone]

## 2021-01-19 ENCOUNTER — Encounter (HOSPITAL_COMMUNITY)
Admission: RE | Admit: 2021-01-19 | Discharge: 2021-01-19 | Disposition: A | Discharge status: Home / Self Care | Payer: No Typology Code available for payment source | Source: Ambulatory Visit | Attending: Cardiology | Admitting: Cardiology

## 2021-01-19 ENCOUNTER — Encounter (HOSPITAL_COMMUNITY): Payer: No Typology Code available for payment source

## 2021-01-19 VITALS — Wt 219.4 lb

## 2021-01-19 DIAGNOSIS — I2111 ST elevation (STEMI) myocardial infarction involving right coronary artery: Secondary | ICD-10-CM | POA: Diagnosis not present

## 2021-01-19 DIAGNOSIS — Z955 Presence of coronary angioplasty implant and graft: Secondary | ICD-10-CM

## 2021-01-19 NOTE — Progress Notes (Signed)
Daily Session Note  Patient Details  Name: John Wilkerson MRN: [id number] Date of Birth: [birth year] Referring Provider:   Flowsheet Row CARDIAC REHAB PHASE II ORIENTATION from 12/29/2020 in [place]  Referring Provider Dr. Saunders Revel       Encounter Date: 01/19/2021  Check In:  Session Check In - 01/19/21 1100       Check-In   Supervising physician immediately available to respond to emergencies [facility] immediately available    Physician(s) Dr. Gasper Sells    Location [facility]    Staff Present Hoy Register, MS, ACSM-CEP, Exercise Physiologist;Monseratt Ledin Zigmund Daniel, Exercise Physiologist    Virtual Visit No    Medication changes reported     No    Fall or balance concerns reported    No    Tobacco Cessation No Change    Warm-up and Cool-down Performed as group-led instruction    Resistance Training Performed Yes    VAD Patient? No    PAD/SET Patient? No      Pain Assessment   Currently in Pain? No/denies    Multiple Pain Sites No             Capillary Blood Glucose: No results found for this or any previous visit (from the past 24 hour(s)).    Social History   Tobacco Use  Smoking Status Former   Packs/day: 1.00   Types: Cigarettes  Smokeless Tobacco Never    Goals Met:  Independence with exercise equipment Exercise tolerated well No report of concerns or symptoms today Strength training completed today  Goals Unmet:  Not Applicable  Comments: check out 1200   Dr. Kathie Dike is Medical Director for [facility] Pulmonary Rehab.

## 2021-01-21 ENCOUNTER — Encounter (HOSPITAL_COMMUNITY): Payer: No Typology Code available for payment source

## 2021-01-21 ENCOUNTER — Encounter (HOSPITAL_COMMUNITY)
Admission: RE | Admit: 2021-01-21 | Discharge: 2021-01-21 | Disposition: A | Discharge status: Home / Self Care | Payer: No Typology Code available for payment source | Source: Ambulatory Visit | Attending: Cardiology | Admitting: Cardiology

## 2021-01-21 DIAGNOSIS — I2111 ST elevation (STEMI) myocardial infarction involving right coronary artery: Secondary | ICD-10-CM

## 2021-01-21 DIAGNOSIS — Z955 Presence of coronary angioplasty implant and graft: Secondary | ICD-10-CM

## 2021-01-21 NOTE — Progress Notes (Signed)
Daily Session Note  Patient Details  Name: John Wilkerson MRN: [id number] Date of Birth: [birth year] Referring Provider:   Flowsheet Row CARDIAC REHAB PHASE II ORIENTATION from 12/29/2020 in [place]  Referring Provider Dr. Saunders Revel       Encounter Date: 01/21/2021  Check In:  Session Check In - 01/21/21 1100       Check-In   Supervising physician immediately available to respond to emergencies [facility] immediately available    Physician(s) Dr. Radford Pax    Location [facility]    Staff Present Hoy Register, MS, ACSM-CEP, Exercise Physiologist;Heather Otho Ket, BS, Exercise Physiologist;Debra Wynetta Emery, RN, BSN    Virtual Visit No    Medication changes reported     No    Fall or balance concerns reported    No    Tobacco Cessation No Change    Warm-up and Cool-down Performed as group-led instruction    Resistance Training Performed Yes    VAD Patient? No    PAD/SET Patient? No      Pain Assessment   Currently in Pain? No/denies    Multiple Pain Sites No             Capillary Blood Glucose: No results found for this or any previous visit (from the past 24 hour(s)).    Social History   Tobacco Use  Smoking Status Former   Packs/day: 1.00   Types: Cigarettes  Smokeless Tobacco Never    Goals Met:  Independence with exercise equipment Exercise tolerated well No report of concerns or symptoms today Strength training completed today  Goals Unmet:  Not Applicable  Comments: checkout time is 1200   Dr. Kathie Dike is Medical Director for [facility] Pulmonary Rehab.

## 2021-01-21 NOTE — Progress Notes (Signed)
Cardiac Individual Treatment Plan  Patient Details  Name: John Wilkerson MRN: [id number] Date of Birth: 1960/07/26 Referring Provider:   Flowsheet Row CARDIAC REHAB PHASE II ORIENTATION from 12/29/2020 in [facility]  Referring Provider Dr. Saunders Revel       Initial Encounter Date:  Flowsheet Row CARDIAC REHAB PHASE II ORIENTATION from 12/29/2020 in [place]  Date 12/29/20       Visit Diagnosis: ST elevation myocardial infarction involving right coronary artery [facility])  Status post coronary artery stent placement  Patient's Home Medications on Admission:  Current Outpatient Medications:    amLODipine (NORVASC) 10 MG tablet, Take 1 tablet (10 mg total) by mouth daily., Disp: 30 tablet, Rfl: 11   aspirin 81 MG chewable tablet, Chew 1 tablet (81 mg total) by mouth daily., Disp: 90 tablet, Rfl: 3   atorvastatin (LIPITOR) 80 MG tablet, Take 1 tablet (80 mg total) by mouth daily., Disp: 30 tablet, Rfl: 11   carvedilol (COREG) 12.5 MG tablet, Take 1 tablet (12.5 mg total) by mouth 2 (two) times daily with a meal., Disp: 60 tablet, Rfl: 11   empagliflozin (JARDIANCE) 10 MG TABS tablet, Take 1 tablet (10 mg total) by mouth daily., Disp: 30 tablet, Rfl: 11   escitalopram (LEXAPRO) 5 MG tablet, Take 5 mg by mouth daily., Disp: , Rfl:    Multiple Vitamin (MULTIVITAMIN ADULT PO), Take 1 tablet by mouth daily., Disp: , Rfl:    nitroGLYCERIN (NITROSTAT) 0.4 MG SL tablet, Place 1 tablet (0.4 mg total) under the tongue every 5 (five) minutes x 3 doses as needed for chest pain., Disp: 100 tablet, Rfl: 1   Omega-3 Fatty Acids (FISH OIL OMEGA-3 PO), Take 2 capsules by mouth daily., Disp: , Rfl:    ticagrelor (BRILINTA) 90 MG TABS tablet, Take 1 tablet (90 mg total) by mouth 2 (two) times daily., Disp: 60 tablet, Rfl: 11   Turmeric (QC TUMERIC COMPLEX PO), Take 1 tablet by mouth daily., Disp: , Rfl:   Past Medical History: Past Medical History:  Diagnosis Date    Hiatal hernia     Tobacco Use: Social History   Tobacco Use  Smoking Status Former   Packs/day: 1.00   Types: Cigarettes  Smokeless Tobacco Never    Labs: Recent Review Flowsheet Data     Labs for ITP Cardiac and Pulmonary Rehab Latest Ref Rng & Units 08/16/2020 08/16/2020 08/18/2020 08/18/2020   Cholestrol 0 - 200 mg/dL 223(H) - - -   LDLCALC 0 - 99 mg/dL 153(H) - - -   HDL >40 mg/dL 34(L) - - -   Trlycerides <150 mg/dL 180(H) - - -   Hemoglobin A1c 4.8 - 5.6 % 5.6 5.6 - -   PHART 7.350 - 7.450 - - - 7.481(H)   PCO2ART 32.0 - 48.0 mmHg - - - 30.3(L)   HCO3 20.0 - 28.0 mmol/L - - 25.7 22.7   TCO2 22 - 32 mmol/L - - - 24   O2SAT % - - 41.1 93.0       Capillary Blood Glucose: Lab Results  Component Value Date   GLUCAP 117 (H) 01/05/2021   GLUCAP 138 (H) 01/02/2021   GLUCAP 97 12/31/2020   GLUCAP 88 12/31/2020   GLUCAP 102 (H) 12/29/2020    POCT Glucose     Row Name 12/29/20 1332 01/05/21 1313           POCT Blood Glucose   Pre-Exercise 102 mg/dL --  Pre-Exercise #2 -- 117 mg/dL               Exercise Target Goals: Exercise Program Goal: Individual exercise prescription set using results from initial 6 min walk test and THRR while considering  patient's activity barriers and safety.   Exercise Prescription Goal: Starting with aerobic activity 30 plus minutes a day, 3 days per week for initial exercise prescription. Provide home exercise prescription and guidelines that participant acknowledges understanding prior to discharge.  Activity Barriers & Risk Stratification:  Activity Barriers & Cardiac Risk Stratification - 12/29/20 1300       Activity Barriers & Cardiac Risk Stratification   Activity Barriers Arthritis;Back Problems    Cardiac Risk Stratification High             6 Minute Walk:  6 Minute Walk     Row Name 12/29/20 1404         6 Minute Walk   Phase Initial     Distance 1250 feet     Walk Time 6 minutes     # of Rest  Breaks 0     MPH 2.4     METS 3.04     RPE 14     VO2 Peak 10.65     Symptoms No     Resting HR 63 bpm     Resting BP 126/64     Resting Oxygen Saturation  96 %     Exercise Oxygen Saturation  during 6 min walk 96 %     Max Ex. HR 75 bpm     Max Ex. BP 145/75     2 Minute Post BP 130/70              Oxygen Initial Assessment:   Oxygen Re-Evaluation:   Oxygen Discharge (Final Oxygen Re-Evaluation):   Initial Exercise Prescription:  Initial Exercise Prescription - 12/29/20 1400       Date of Initial Exercise RX and Referring Provider   Date 12/29/20    Referring Provider Dr. Saunders Revel    Expected Discharge Date 03/23/21      Treadmill   MPH 1.7    Grade 0    Minutes 17      NuStep   Level 1    SPM 70    Minutes 22      Prescription Details   Frequency (times per week) 3    Duration Progress to 30 minutes of continuous aerobic without signs/symptoms of physical distress      Intensity   THRR 40-80% of Max Heartrate 64-127    Ratings of Perceived Exertion 11-15    Perceived Dyspnea 0-4      Resistance Training   Training Prescription Yes    Weight 3    Reps 10-15             Perform Capillary Blood Glucose checks as needed.  Exercise Prescription Changes:   Exercise Prescription Changes     Row Name 01/05/21 1300 01/16/21 1100 01/19/21 1300         Response to Exercise   Blood Pressure (Admit) 128/62 -- 130/68     Blood Pressure (Exercise) 128/68 -- 140/60     Blood Pressure (Exit) 128/70 -- 120/65     Heart Rate (Admit) 60 bpm -- 70 bpm     Heart Rate (Exercise) 79 bpm -- 89 bpm     Heart Rate (Exit) 69 bpm -- 79 bpm     Rating of Perceived Exertion (  Exercise) 16 -- 14     Duration Continue with 30 min of aerobic exercise without signs/symptoms of physical distress. -- Continue with 30 min of aerobic exercise without signs/symptoms of physical distress.     Intensity THRR unchanged -- THRR unchanged       Progression   Progression -- --  Continue to progress workloads to maintain intensity without signs/symptoms of physical distress.       Resistance Training   Training Prescription Yes -- Yes     Weight 4 lbs -- 4 lbs     Reps 10-15 -- 10-15     Time 10 Minutes -- 10 Minutes       Treadmill   MPH 1.7 -- 1.7     Grade 0 -- 0     Minutes 22 -- 22     METs 2.3 -- 2.3       NuStep   Level 1 -- 1     SPM 86 -- 95     Minutes 17 -- 17     METs 2.11 -- 2.23       Home Exercise Plan   Plans to continue exercise at -- Home (comment) --     Frequency -- Add 2 additional days to program exercise sessions. --     Initial Home Exercises Provided -- 01/16/21 --              Exercise Comments:   Exercise Comments     Row Name 01/16/21 1141           Exercise Comments home exercise reviewed                Exercise Goals and Review:   Exercise Goals     Row Name 12/29/20 1408 01/19/21 1317           Exercise Goals   Increase Physical Activity Yes Yes      Intervention Provide advice, education, support and counseling about physical activity/exercise needs.;Develop an individualized exercise prescription for aerobic and resistive training based on initial evaluation findings, risk stratification, comorbidities and participant's personal goals. --      Expected Outcomes Short Term: Attend rehab on a regular basis to increase amount of physical activity.;Long Term: Add in home exercise to make exercise part of routine and to increase amount of physical activity.;Long Term: Exercising regularly at least 3-5 days a week. Short Term: Attend rehab on a regular basis to increase amount of physical activity.;Long Term: Add in home exercise to make exercise part of routine and to increase amount of physical activity.;Long Term: Exercising regularly at least 3-5 days a week.      Increase Strength and Stamina Yes Yes      Intervention Provide advice, education, support and counseling about physical  activity/exercise needs.;Develop an individualized exercise prescription for aerobic and resistive training based on initial evaluation findings, risk stratification, comorbidities and participant's personal goals. Provide advice, education, support and counseling about physical activity/exercise needs.;Develop an individualized exercise prescription for aerobic and resistive training based on initial evaluation findings, risk stratification, comorbidities and participant's personal goals.      Expected Outcomes Short Term: Increase workloads from initial exercise prescription for resistance, speed, and METs.;Short Term: Perform resistance training exercises routinely during rehab and add in resistance training at home;Long Term: Improve cardiorespiratory fitness, muscular endurance and strength as measured by increased METs and functional capacity (6MWT) Short Term: Increase workloads from initial exercise prescription for resistance, speed, and METs.;Short Term: Perform  resistance training exercises routinely during rehab and add in resistance training at home;Long Term: Improve cardiorespiratory fitness, muscular endurance and strength as measured by increased METs and functional capacity (6MWT)      Able to understand and use rate of perceived exertion (RPE) scale Yes Yes      Intervention Provide education and explanation on how to use RPE scale Provide education and explanation on how to use RPE scale      Expected Outcomes Short Term: Able to use RPE daily in rehab to express subjective intensity level;Long Term:  Able to use RPE to guide intensity level when exercising independently Short Term: Able to use RPE daily in rehab to express subjective intensity level;Long Term:  Able to use RPE to guide intensity level when exercising independently      Knowledge and understanding of Target Heart Rate Range (THRR) Yes Yes      Intervention Provide education and explanation of THRR including how the numbers  were predicted and where they are located for reference Provide education and explanation of THRR including how the numbers were predicted and where they are located for reference      Expected Outcomes Short Term: Able to state/look up THRR;Short Term: Able to use daily as guideline for intensity in rehab;Long Term: Able to use THRR to govern intensity when exercising independently Short Term: Able to state/look up THRR;Short Term: Able to use daily as guideline for intensity in rehab;Long Term: Able to use THRR to govern intensity when exercising independently      Able to check pulse independently Yes Yes      Intervention Provide education and demonstration on how to check pulse in carotid and radial arteries.;Review the importance of being able to check your own pulse for safety during independent exercise Provide education and demonstration on how to check pulse in carotid and radial arteries.;Review the importance of being able to check your own pulse for safety during independent exercise      Expected Outcomes Short Term: Able to explain why pulse checking is important during independent exercise;Long Term: Able to check pulse independently and accurately Short Term: Able to explain why pulse checking is important during independent exercise;Long Term: Able to check pulse independently and accurately      Understanding of Exercise Prescription Yes Yes      Intervention Provide education, explanation, and written materials on patient's individual exercise prescription Provide education, explanation, and written materials on patient's individual exercise prescription      Expected Outcomes Short Term: Able to explain program exercise prescription;Long Term: Able to explain home exercise prescription to exercise independently Short Term: Able to explain program exercise prescription;Long Term: Able to explain home exercise prescription to exercise independently               Exercise Goals  Re-Evaluation :  Exercise Goals Re-Evaluation     Row Name 01/19/21 1317             Exercise Goal Re-Evaluation   Exercise Goals Review Increase Physical Activity;Increase Strength and Stamina;Able to understand and use rate of perceived exertion (RPE) scale;Knowledge and understanding of Target Heart Rate Range (THRR);Able to check pulse independently;Understanding of Exercise Prescription       Comments Pt has completed 10 sessions of cardiac rehab. He is limited by hip pain and sometimes he is unable to walk his normal speed on the treadmill due to the pain. He is beginning to get his stamina back and is beginning to  progress. He is currently exercising at 2.3 METs. Will continue to monitor and progress as able.       Expected Outcomes Through exercise at rehab and at home, the patient will meet their stated goals.                 Discharge Exercise Prescription (Final Exercise Prescription Changes):  Exercise Prescription Changes - 01/19/21 1300       Response to Exercise   Blood Pressure (Admit) 130/68    Blood Pressure (Exercise) 140/60    Blood Pressure (Exit) 120/65    Heart Rate (Admit) 70 bpm    Heart Rate (Exercise) 89 bpm    Heart Rate (Exit) 79 bpm    Rating of Perceived Exertion (Exercise) 14    Duration Continue with 30 min of aerobic exercise without signs/symptoms of physical distress.    Intensity THRR unchanged      Progression   Progression Continue to progress workloads to maintain intensity without signs/symptoms of physical distress.      Resistance Training   Training Prescription Yes    Weight 4 lbs    Reps 10-15    Time 10 Minutes      Treadmill   MPH 1.7    Grade 0    Minutes 22    METs 2.3      NuStep   Level 1    SPM 95    Minutes 17    METs 2.23             Nutrition:  Target Goals: Understanding of nutrition guidelines, daily intake of sodium 1500mg , cholesterol 200mg , calories 30% from fat and 7% or less from saturated  fats, daily to have 5 or more servings of fruits and vegetables.  Biometrics:  Pre Biometrics - 12/29/20 1408       Pre Biometrics   Height 5\' 11"  (1.803 m)    Weight 97.4 kg    Waist Circumference 43 inches    Hip Circumference 43 inches    Waist to Hip Ratio 1 %    BMI (Calculated) 29.96    Triceps Skinfold 11 mm    % Body Fat 28 %    Grip Strength 38.9 kg    Flexibility 4 in    Single Leg Stand 4 seconds              Nutrition Therapy Plan and Nutrition Goals:   Nutrition Assessments:  Nutrition Assessments - 12/29/20 1304       MEDFICTS Scores   Pre Score 25            MEDIFICTS Score Key: ?70 Need to make dietary changes  40-70 Heart Healthy Diet ? 40 Therapeutic Level Cholesterol Diet   Picture Your Plate Scores: 12/31/20 Unhealthy dietary pattern with much room for improvement. 41-50 Dietary pattern unlikely to meet recommendations for good health and room for improvement. 51-60 More healthful dietary pattern, with some room for improvement.  >60 Healthy dietary pattern, although there may be some specific behaviors that could be improved.    Nutrition Goals Re-Evaluation:   Nutrition Goals Discharge (Final Nutrition Goals Re-Evaluation):   Psychosocial: Target Goals: Acknowledge presence or absence of significant depression and/or stress, maximize coping skills, provide positive support system. Participant is able to verbalize types and ability to use techniques and skills needed for reducing stress and depression.  Initial Review & Psychosocial Screening:  Initial Psych Review & Screening - 12/29/20 1301  Initial Review   Current issues with Current Anxiety/Panic   escitalopram recently increased to 10 mg daily     Family Dynamics   Good Support System? Yes    Comments His support system includes his three children and his wife.      Barriers   Psychosocial barriers to participate in program There are no identifiable barriers or  psychosocial needs.;The patient should benefit from training in stress management and relaxation.      Screening Interventions   Interventions Encouraged to exercise    Expected Outcomes Long Term Goal: Stressors or current issues are controlled or eliminated.;Short Term goal: Identification and review with participant of any Quality of Life or Depression concerns found by scoring the questionnaire.;Long Term goal: The participant improves quality of Life and PHQ9 Scores as seen by post scores and/or verbalization of changes             Quality of Life Scores:  Quality of Life - 12/29/20 1409       Quality of Life   Select Quality of Life      Quality of Life Scores   Health/Function Pre 22 %    Socioeconomic Pre 18 %    Psych/Spiritual Pre 16.36 %    Family Pre 27.6 %    GLOBAL Pre 20.84 %            Scores of 19 and below usually indicate a poorer quality of life in these areas.  A difference of  2-3 points is a clinically meaningful difference.  A difference of 2-3 points in the total score of the Quality of Life Index has been associated with significant improvement in overall quality of life, self-image, physical symptoms, and general health in studies assessing change in quality of life.  PHQ-9: Recent Review Flowsheet Data     Depression screen [facility] 2/9 12/29/2020   Decreased Interest 0   Down, Depressed, Hopeless 0   PHQ - 2 Score 0   Altered sleeping 1   Tired, decreased energy 2   Change in appetite 0   Feeling bad or failure about yourself  1   Trouble concentrating 0   Moving slowly or fidgety/restless 1   Suicidal thoughts 0   PHQ-9 Score 5   Difficult doing work/chores Not difficult at all      Interpretation of Total Score  Total Score Depression Severity:  1-4 = Minimal depression, 5-9 = Mild depression, 10-14 = Moderate depression, 15-19 = Moderately severe depression, 20-27 = Severe depression   Psychosocial Evaluation and Intervention:   Psychosocial Evaluation - 12/29/20 1344       Psychosocial Evaluation & Interventions   Interventions Encouraged to exercise with the program and follow exercise prescription    Comments Pt has no barriers to completing rehab. Pt has no identifiable psychosocial issues. He does take lexapro which helps him with anxiety and sleep. He biggest issue right now is his lack of energy since his STEMI and stents. He reports that he has a good support system with his wife and his children and copes well with his problems. He has lost over 20 lbs since his was in the hospital by cleaning up his diet. He hopes to lose about 10 more lbs while in the program in order to weight less than 200 lbs. His other goals include gaining his strength and stamina back. He is eager to start the program.    Expected Outcomes Pt will continue to take lexapro for anxiety  and will not have any other identifiable psychosocial issues.    Continue Psychosocial Services  No Follow up required             Psychosocial Re-Evaluation:  Psychosocial Re-Evaluation     [place] Name 01/14/21 1404             Psychosocial Re-Evaluation   Current issues with Current Anxiety/Panic       Comments Patient is new to the program completing 7 sessions. He continues to have no psychosocial barriers identified. His anxiety is managed with Lexapro. He seems to enjoy coming to the program and demonstrates an interest in improving his health. We will continue to monitor for progress.       Expected Outcomes Patient will continue to have no psychosocial barriers identified.       Interventions Stress management education;Encouraged to attend Cardiac Rehabilitation for the exercise;Relaxation education       Continue Psychosocial Services  No Follow up required                Psychosocial Discharge (Final Psychosocial Re-Evaluation):  Psychosocial Re-Evaluation - 01/14/21 1404       Psychosocial Re-Evaluation   Current issues with  Current Anxiety/Panic    Comments Patient is new to the program completing 7 sessions. He continues to have no psychosocial barriers identified. His anxiety is managed with Lexapro. He seems to enjoy coming to the program and demonstrates an interest in improving his health. We will continue to monitor for progress.    Expected Outcomes Patient will continue to have no psychosocial barriers identified.    Interventions Stress management education;Encouraged to attend Cardiac Rehabilitation for the exercise;Relaxation education    Continue Psychosocial Services  No Follow up required             Vocational Rehabilitation: Provide vocational rehab assistance to qualifying candidates.   Vocational Rehab Evaluation & Intervention:  Vocational Rehab - 12/29/20 1318       Initial Vocational Rehab Evaluation & Intervention   Assessment shows need for Vocational Rehabilitation No             Education: Education Goals: Education classes will be provided on a weekly basis, covering required topics. Participant will state understanding/return demonstration of topics presented.  Learning Barriers/Preferences:  Learning Barriers/Preferences - 12/29/20 1315       Learning Barriers/Preferences   Learning Barriers None    Learning Preferences Audio;Computer/Internet;Group Instruction;Skilled Demonstration;Pictoral;Individual Instruction;Verbal Instruction;Video;Written Material             Education Topics: Hypertension, Hypertension Reduction -Define heart disease and high blood pressure. Discus how high blood pressure affects the body and ways to reduce high blood pressure.   Exercise and Your Heart -Discuss why it is important to exercise, the FITT principles of exercise, normal and abnormal responses to exercise, and how to exercise safely.   Angina -Discuss definition of angina, causes of angina, treatment of angina, and how to decrease risk of having angina. Flowsheet  Row CARDIAC REHAB PHASE II EXERCISE from 01/14/2021 in [place]  Date 12/31/20  Educator [place]  Instruction Review Code 2- Demonstrated Understanding       Cardiac Medications -Review what the following cardiac medications are used for, how they affect the body, and side effects that may occur when taking the medications.  Medications include Aspirin, Beta blockers, calcium channel blockers, ACE Inhibitors, angiotensin receptor blockers, diuretics, digoxin, and antihyperlipidemics. Flowsheet Row CARDIAC REHAB PHASE II EXERCISE from 01/14/2021 in  [place]  Date 01/07/21  Educator DF  Instruction Review Code 2- Demonstrated Understanding       Congestive Heart Failure -Discuss the definition of CHF, how to live with CHF, the signs and symptoms of CHF, and how keep track of weight and sodium intake. Flowsheet Row CARDIAC REHAB PHASE II EXERCISE from 01/14/2021 in [place]  Date 01/14/21  Educator DJ  Instruction Review Code 1- Verbalizes Understanding       Heart Disease and Intimacy -Discus the effect sexual activity has on the heart, how changes occur during intimacy as we age, and safety during sexual activity.   Smoking Cessation / COPD -Discuss different methods to quit smoking, the health benefits of quitting smoking, and the definition of COPD.   Nutrition I: Fats -Discuss the types of cholesterol, what cholesterol does to the heart, and how cholesterol levels can be controlled.   Nutrition II: Labels -Discuss the different components of food labels and how to read food label   Heart Parts/Heart Disease and PAD -Discuss the anatomy of the heart, the pathway of blood circulation through the heart, and these are affected by heart disease.   Stress I: Signs and Symptoms -Discuss the causes of stress, how stress may lead to anxiety and depression, and ways to limit stress.   Stress II:  Relaxation -Discuss different types of relaxation techniques to limit stress.   Warning Signs of Stroke / TIA -Discuss definition of a stroke, what the signs and symptoms are of a stroke, and how to identify when someone is having stroke.   Knowledge Questionnaire Score:  Knowledge Questionnaire Score - 12/29/20 1315       Knowledge Questionnaire Score   Pre Score 23/28             Core Components/Risk Factors/Patient Goals at Admission:  Personal Goals and Risk Factors at Admission - 12/29/20 1318       Core Components/Risk Factors/Patient Goals on Admission    Weight Management Yes;Obesity;Weight Loss    Intervention Weight Management: Develop a combined nutrition and exercise program designed to reach desired caloric intake, while maintaining appropriate intake of nutrient and fiber, sodium and fats, and appropriate energy expenditure required for the weight goal.;Weight Management: Provide education and appropriate resources to help participant work on and attain dietary goals.;Weight Management/Obesity: Establish reasonable short term and long term weight goals.;Obesity: Provide education and appropriate resources to help participant work on and attain dietary goals.    Admit Weight 210 lb (95.3 kg)    Goal Weight: Short Term 200 lb (90.7 kg)    Expected Outcomes Short Term: Continue to assess and modify interventions until short term weight is achieved;Long Term: Adherence to nutrition and physical activity/exercise program aimed toward attainment of established weight goal;Weight Maintenance: Understanding of the daily nutrition guidelines, which includes 25-35% calories from fat, 7% or less cal from saturated fats, less than 200mg  cholesterol, less than 1.5gm of sodium, & 5 or more servings of fruits and vegetables daily;Weight Loss: Understanding of general recommendations for a balanced deficit meal plan, which promotes 1-2 lb weight loss per week and includes a negative  energy balance of [phone] kcal/d;Understanding recommendations for meals to include 15-35% energy as protein, 25-35% energy from fat, 35-60% energy from carbohydrates, less than 200mg  of dietary cholesterol, 20-35 gm of total fiber daily;Understanding of distribution of calorie intake throughout the day with the consumption of 4-5 meals/snacks    Improve shortness of breath with ADL's Yes  Intervention Provide education, individualized exercise plan and daily activity instruction to help decrease symptoms of SOB with activities of daily living.    Expected Outcomes Short Term: Improve cardiorespiratory fitness to achieve a reduction of symptoms when performing ADLs;Long Term: Be able to perform more ADLs without symptoms or delay the onset of symptoms             Core Components/Risk Factors/Patient Goals Review:   Goals and Risk Factor Review     Row Name 01/14/21 1406             Core Components/Risk Factors/Patient Goals Review   Personal Goals Review Weight Management/Obesity       Review Patient was referred to CR with STEMI and S/P Stent. He has multiple risk factors for CAD and is participating in the program for risk modification. He has not been diagnosed with DM but is taking on Jardiance for HF. His last A1C was 08/16/20 at 5.6%. His blood pressure is well controlled. His personal goals for the program are to lose 10 lbs; increase his strenght and stamina and increase his energy level. We will continue to monitor his progress as he works towards meeting these goals.       Expected Outcomes Patient will complete the program meeting both personal and program goals.                Core Components/Risk Factors/Patient Goals at Discharge (Final Review):   Goals and Risk Factor Review - 01/14/21 1406       Core Components/Risk Factors/Patient Goals Review   Personal Goals Review Weight Management/Obesity    Review Patient was referred to CR with STEMI and S/P Stent. He has  multiple risk factors for CAD and is participating in the program for risk modification. He has not been diagnosed with DM but is taking on Jardiance for HF. His last A1C was 08/16/20 at 5.6%. His blood pressure is well controlled. His personal goals for the program are to lose 10 lbs; increase his strenght and stamina and increase his energy level. We will continue to monitor his progress as he works towards meeting these goals.    Expected Outcomes Patient will complete the program meeting both personal and program goals.             ITP Comments:   Comments: ITP REVIEW Pt is making expected progress toward Cardiac Rehab goals after completing 10 sessions. Recommend continued exercise, life style modification, education, and increased stamina and strength.

## 2021-01-23 ENCOUNTER — Encounter (HOSPITAL_COMMUNITY): Payer: No Typology Code available for payment source

## 2021-01-23 ENCOUNTER — Encounter (HOSPITAL_COMMUNITY)
Admission: RE | Admit: 2021-01-23 | Discharge: 2021-01-23 | Disposition: A | Discharge status: Home / Self Care | Payer: No Typology Code available for payment source | Source: Ambulatory Visit | Attending: Cardiology | Admitting: Cardiology

## 2021-01-23 DIAGNOSIS — Z955 Presence of coronary angioplasty implant and graft: Secondary | ICD-10-CM

## 2021-01-23 DIAGNOSIS — I2111 ST elevation (STEMI) myocardial infarction involving right coronary artery: Secondary | ICD-10-CM

## 2021-01-23 NOTE — Progress Notes (Signed)
Daily Session Note  Patient Details  Name: John Wilkerson MRN: [id number] Date of Birth: May 21, 1960 Referring Provider:   Flowsheet Row CARDIAC REHAB PHASE II ORIENTATION from 12/29/2020 in [place]  Referring Provider Dr. Saunders Revel       Encounter Date: 01/23/2021  Check In:  Session Check In - 01/23/21 1100       Check-In   Supervising physician immediately available to respond to emergencies [facility] MD immediately available    Physician(s) Dr. Harl Bowie    Location [facility]    Staff Present Hoy Register, MS, ACSM-CEP, Exercise Physiologist;Heather Zigmund Daniel, Exercise Physiologist;Francene Mcerlean Wynetta Emery, RN, BSN    Virtual Visit No    Medication changes reported     No    Fall or balance concerns reported    No    Tobacco Cessation No Change    Warm-up and Cool-down Performed as group-led instruction    Resistance Training Performed Yes    VAD Patient? No    PAD/SET Patient? No      Pain Assessment   Currently in Pain? No/denies    Multiple Pain Sites No             Capillary Blood Glucose: No results found for this or any previous visit (from the past 24 hour(s)).    Social History   Tobacco Use  Smoking Status Former   Packs/day: 1.00   Types: Cigarettes  Smokeless Tobacco Never    Goals Met:  Independence with exercise equipment Exercise tolerated well No report of concerns or symptoms today Strength training completed today  Goals Unmet:  Not Applicable  Comments: Check out 1200.   Dr. Kathie Dike is Medical Director for [facility] Re[facility].

## 2021-01-26 ENCOUNTER — Encounter (HOSPITAL_COMMUNITY): Payer: No Typology Code available for payment source

## 2021-01-26 ENCOUNTER — Encounter (HOSPITAL_COMMUNITY)
Admission: RE | Admit: 2021-01-26 | Discharge: 2021-01-26 | Disposition: A | Discharge status: Home / Self Care | Payer: No Typology Code available for payment source | Source: Ambulatory Visit | Attending: Cardiology | Admitting: Cardiology

## 2021-01-26 DIAGNOSIS — I2111 ST elevation (STEMI) myocardial infarction involving right coronary artery: Secondary | ICD-10-CM

## 2021-01-26 DIAGNOSIS — Z955 Presence of coronary angioplasty implant and graft: Secondary | ICD-10-CM

## 2021-01-26 NOTE — Progress Notes (Signed)
Daily Session Note  Patient Details  Name: John Wilkerson MRN: [id number] Date of Birth: 08-05-1960 Referring Provider:   Flowsheet Row CARDIAC REHAB PHASE II ORIENTATION from 12/29/2020 in [place]  Referring Provider Dr. Saunders Revel       Encounter Date: 01/26/2021  Check In:  Session Check In - 01/26/21 1100       Check-In   Supervising physician immediately available to respond to emergencies [facility] MD immediately available    Physician(s) Dr. Domenic Polite    Location [facility]    Staff Present Hoy Register, MS, ACSM-CEP, Exercise Physiologist;Heather Otho Ket, BS, Exercise Physiologist;Debra Wynetta Emery, RN, BSN    Virtual Visit No    Medication changes reported     No    Fall or balance concerns reported    No    Tobacco Cessation No Change    Warm-up and Cool-down Performed as group-led instruction    Resistance Training Performed Yes    VAD Patient? No    PAD/SET Patient? No      Pain Assessment   Currently in Pain? No/denies    Multiple Pain Sites No             Capillary Blood Glucose: No results found for this or any previous visit (from the past 24 hour(s)).    Social History   Tobacco Use  Smoking Status Former   Packs/day: 1.00   Types: Cigarettes  Smokeless Tobacco Never    Goals Met:  Independence with exercise equipment Exercise tolerated well No report of concerns or symptoms today Strength training completed today  Goals Unmet:  Not Applicable  Comments: checkout time is 1200   Dr. Kathie Dike is Medical Director for [facility] Pulmonary Re[facility].

## 2021-01-28 ENCOUNTER — Encounter (HOSPITAL_COMMUNITY)
Admission: RE | Admit: 2021-01-28 | Discharge: 2021-01-28 | Disposition: A | Discharge status: Home / Self Care | Payer: No Typology Code available for payment source | Source: Ambulatory Visit | Attending: Cardiology | Admitting: Cardiology

## 2021-01-28 ENCOUNTER — Encounter (HOSPITAL_COMMUNITY): Payer: No Typology Code available for payment source

## 2021-01-28 DIAGNOSIS — I2111 ST elevation (STEMI) myocardial infarction involving right coronary artery: Secondary | ICD-10-CM

## 2021-01-28 DIAGNOSIS — Z955 Presence of coronary angioplasty implant and graft: Secondary | ICD-10-CM

## 2021-01-28 NOTE — Progress Notes (Signed)
Daily Session Note  Patient Details  Name: John Wilkerson MRN: [id number] Date of Birth: 1960-11-17 Referring Provider:   Flowsheet Row CARDIAC REHAB PHASE II ORIENTATION from 12/29/2020 in [place]  Referring Provider Dr. Saunders Revel       Encounter Date: 01/28/2021  Check In:  Session Check In - 01/28/21 1100       Check-In   Supervising physician immediately available to respond to emergencies [facility] immediately available    Physician(s) Dr. Domenic Polite    Location [facility]    Staff Present Hoy Register, MS, ACSM-CEP, Exercise Physiologist;Heather Otho Ket, BS, Exercise Physiologist;Raelan Burgoon Wynetta Emery, RN, BSN    Virtual Visit No    Medication changes reported     No    Fall or balance concerns reported    No    Tobacco Cessation No Change    Warm-up and Cool-down Performed as group-led instruction    Resistance Training Performed Yes    VAD Patient? No    PAD/SET Patient? No      Pain Assessment   Currently in Pain? No/denies    Multiple Pain Sites No             Capillary Blood Glucose: No results found for this or any previous visit (from the past 24 hour(s)).    Social History   Tobacco Use  Smoking Status Former   Packs/day: 1.00   Types: Cigarettes  Smokeless Tobacco Never    Goals Met:  Independence with exercise equipment Exercise tolerated well No report of concerns or symptoms today Strength training completed today  Goals Unmet:  Not Applicable  Comments: Check out 1200.   Dr. Kathie Dike is Medical Director for [facility] Pulmonary Rehab.

## 2021-01-30 ENCOUNTER — Encounter (HOSPITAL_COMMUNITY)
Admission: RE | Admit: 2021-01-30 | Discharge: 2021-01-30 | Disposition: A | Discharge status: Home / Self Care | Payer: No Typology Code available for payment source | Source: Ambulatory Visit | Attending: Cardiology | Admitting: Cardiology

## 2021-01-30 ENCOUNTER — Encounter (HOSPITAL_COMMUNITY): Payer: No Typology Code available for payment source

## 2021-01-30 DIAGNOSIS — Z955 Presence of coronary angioplasty implant and graft: Secondary | ICD-10-CM

## 2021-01-30 DIAGNOSIS — I2111 ST elevation (STEMI) myocardial infarction involving right coronary artery: Secondary | ICD-10-CM

## 2021-01-30 NOTE — Progress Notes (Signed)
Daily Session Note  Patient Details  Name: John Wilkerson MRN: [id number] Date of Birth: 11/18/1960 Referring Provider:   Flowsheet Row CARDIAC REHAB PHASE II ORIENTATION from 12/29/2020 in [place]  Referring Provider Dr. Saunders Revel       Encounter Date: 01/30/2021  Check In:  Session Check In - 01/30/21 1045       Check-In   Supervising physician immediately available to respond to emergencies [facility] MD immediately available    Physician(s) Dr. Domenic Polite    Location [facility]    Staff Present Hoy Register, MS, ACSM-CEP, Exercise Physiologist;Heather Otho Ket, BS, Exercise Physiologist;Debra Wynetta Emery, RN, BSN    Virtual Visit No    Medication changes reported     No    Fall or balance concerns reported    No    Tobacco Cessation No Change    Warm-up and Cool-down Performed as group-led instruction    Resistance Training Performed Yes    VAD Patient? No    PAD/SET Patient? No      Pain Assessment   Currently in Pain? No/denies    Multiple Pain Sites No             Capillary Blood Glucose: No results found for this or any previous visit (from the past 24 hour(s)).    Social History   Tobacco Use  Smoking Status Former   Packs/day: 1.00   Types: Cigarettes  Smokeless Tobacco Never    Goals Met:  Independence with exercise equipment Exercise tolerated well No report of concerns or symptoms today Strength training completed today  Goals Unmet:  Not Applicable  Comments: checkout time is 1200   Dr. Kathie Dike is Medical Director for [facility] Pulmonary Rehab.

## 2021-02-02 ENCOUNTER — Encounter (HOSPITAL_COMMUNITY)
Admission: RE | Admit: 2021-02-02 | Discharge: 2021-02-02 | Disposition: A | Discharge status: Home / Self Care | Payer: No Typology Code available for payment source | Source: Ambulatory Visit | Attending: Cardiology | Admitting: Cardiology

## 2021-02-02 ENCOUNTER — Encounter (HOSPITAL_COMMUNITY): Payer: No Typology Code available for payment source

## 2021-02-02 VITALS — Wt 220.7 lb

## 2021-02-02 DIAGNOSIS — Z955 Presence of coronary angioplasty implant and graft: Secondary | ICD-10-CM

## 2021-02-02 DIAGNOSIS — I2111 ST elevation (STEMI) myocardial infarction involving right coronary artery: Secondary | ICD-10-CM | POA: Diagnosis not present

## 2021-02-02 NOTE — Progress Notes (Signed)
Daily Session Note  Patient Details  Name: John Wilkerson MRN: [id number] Date of Birth: 09-18-1960 Referring Provider:   Flowsheet Row CARDIAC REHAB PHASE II ORIENTATION from 12/29/2020 in [place]  Referring Provider Dr. Saunders Revel       Encounter Date: 02/02/2021  Check In:  Session Check In - 02/02/21 1100       Check-In   Supervising physician immediately available to respond to emergencies [facility] MD immediately available    Physician(s) Dr. Harl Bowie    Location [facility]    Staff Present Hoy Register, MS, ACSM-CEP, Exercise Physiologist;Heather Zigmund Daniel, Exercise Physiologist;Debra Wynetta Emery, RN, BSN    Virtual Visit No    Medication changes reported     No    Fall or balance concerns reported    No    Tobacco Cessation No Change    Warm-up and Cool-down Performed as group-led instruction    Resistance Training Performed Yes    VAD Patient? No    PAD/SET Patient? No      Pain Assessment   Currently in Pain? No/denies    Multiple Pain Sites No             Capillary Blood Glucose: No results found for this or any previous visit (from the past 24 hour(s)).    Social History   Tobacco Use  Smoking Status Former   Packs/day: 1.00   Types: Cigarettes  Smokeless Tobacco Never    Goals Met:  Independence with exercise equipment Exercise tolerated well No report of concerns or symptoms today Strength training completed today  Goals Unmet:  Not Applicable  Comments: checkout time is 1200   Dr. Kathie Dike is Medical Director for [facility] Pulmonary Rehab.

## 2021-02-04 ENCOUNTER — Encounter (HOSPITAL_COMMUNITY)
Admission: RE | Admit: 2021-02-04 | Discharge: 2021-02-04 | Disposition: A | Discharge status: Home / Self Care | Payer: No Typology Code available for payment source | Source: Ambulatory Visit | Attending: Cardiology | Admitting: Cardiology

## 2021-02-04 ENCOUNTER — Encounter (HOSPITAL_COMMUNITY): Payer: No Typology Code available for payment source

## 2021-02-04 DIAGNOSIS — I2111 ST elevation (STEMI) myocardial infarction involving right coronary artery: Secondary | ICD-10-CM | POA: Diagnosis not present

## 2021-02-04 DIAGNOSIS — Z955 Presence of coronary angioplasty implant and graft: Secondary | ICD-10-CM

## 2021-02-04 NOTE — Progress Notes (Signed)
Daily Session Note  Patient Details  Name: QUAN CYBULSKI MRN: [id number] Date of Birth: [birth year] Referring Provider:   Flowsheet Row CARDIAC REHAB PHASE II ORIENTATION from 12/29/2020 in [place]  Referring Provider Dr. Saunders Revel       Encounter Date: 02/04/2021  Check In:  Session Check In - 02/04/21 1100       Check-In   Supervising physician immediately available to respond to emergencies [facility] MD immediately available    Physician(s) Dr. Harl Bowie    Location [facility]    Staff Present Hoy Register, MS, ACSM-CEP, Exercise Physiologist;Heather Zigmund Daniel, Exercise Physiologist;Debra Wynetta Emery, RN, BSN    Virtual Visit No    Medication changes reported     No    Fall or balance concerns reported    No    Tobacco Cessation No Change    Warm-up and Cool-down Performed as group-led instruction    Resistance Training Performed Yes    VAD Patient? No    PAD/SET Patient? No      Pain Assessment   Currently in Pain? No/denies    Multiple Pain Sites No             Capillary Blood Glucose: No results found for this or any previous visit (from the past 24 hour(s)).    Social History   Tobacco Use  Smoking Status Former   Packs/day: 1.00   Types: Cigarettes  Smokeless Tobacco Never    Goals Met:  Independence with exercise equipment Exercise tolerated well No report of concerns or symptoms today Strength training completed today  Goals Unmet:  Not Applicable  Comments: checkout time is 1200   Dr. Kathie Dike is Medical Director for [facility] Pulmonary Rehab.

## 2021-02-06 ENCOUNTER — Encounter (HOSPITAL_COMMUNITY): Payer: No Typology Code available for payment source

## 2021-02-09 ENCOUNTER — Encounter (HOSPITAL_COMMUNITY): Payer: No Typology Code available for payment source

## 2021-02-09 ENCOUNTER — Encounter (HOSPITAL_COMMUNITY)
Admission: RE | Admit: 2021-02-09 | Discharge: 2021-02-09 | Disposition: A | Discharge status: Home / Self Care | Payer: No Typology Code available for payment source | Source: Ambulatory Visit | Attending: Cardiology | Admitting: Cardiology

## 2021-02-09 DIAGNOSIS — Z955 Presence of coronary angioplasty implant and graft: Secondary | ICD-10-CM

## 2021-02-09 DIAGNOSIS — I2111 ST elevation (STEMI) myocardial infarction involving right coronary artery: Secondary | ICD-10-CM

## 2021-02-09 NOTE — Progress Notes (Signed)
Daily Session Note  Patient Details  Name: John Wilkerson MRN: [id number] Date of Birth: 1960/09/12 Referring Provider:   Flowsheet Row CARDIAC REHAB PHASE II ORIENTATION from 12/29/2020 in [place]  Referring Provider Dr. Saunders Revel       Encounter Date: 02/09/2021  Check In:  Session Check In - 02/09/21 1100       Check-In   Supervising physician immediately available to respond to emergencies [facility] immediately available    Physician(s) Dr. Johney Frame    Location [facility]    Staff Present Geanie Cooley, RN;Heather Otho Ket, BS, Exercise Physiologist;Debra Wynetta Emery, RN, Bjorn Loser, MS, ACSM-CEP, Exercise Physiologist    Virtual Visit No    Medication changes reported     No    Fall or balance concerns reported    No    Tobacco Cessation No Change    Warm-up and Cool-down Performed as group-led instruction    Resistance Training Performed Yes    VAD Patient? No    PAD/SET Patient? No      Pain Assessment   Currently in Pain? No/denies    Multiple Pain Sites No             Capillary Blood Glucose: No results found for this or any previous visit (from the past 24 hour(s)).    Social History   Tobacco Use  Smoking Status Former   Packs/day: 1.00   Types: Cigarettes  Smokeless Tobacco Never    Goals Met:  Independence with exercise equipment Exercise tolerated well No report of concerns or symptoms today Strength training completed today  Goals Unmet:  Not Applicable  Comments: check out @ 12:00pm   Dr. Kathie Dike is Medical Director for [facility] Pulmonary Rehab.

## 2021-02-11 ENCOUNTER — Encounter (HOSPITAL_COMMUNITY): Payer: No Typology Code available for payment source

## 2021-02-11 ENCOUNTER — Encounter (HOSPITAL_COMMUNITY)
Admission: RE | Admit: 2021-02-11 | Discharge: 2021-02-11 | Disposition: A | Discharge status: Home / Self Care | Payer: No Typology Code available for payment source | Source: Ambulatory Visit | Attending: Cardiology | Admitting: Cardiology

## 2021-02-11 DIAGNOSIS — I2111 ST elevation (STEMI) myocardial infarction involving right coronary artery: Secondary | ICD-10-CM

## 2021-02-11 DIAGNOSIS — Z955 Presence of coronary angioplasty implant and graft: Secondary | ICD-10-CM

## 2021-02-11 NOTE — Progress Notes (Signed)
Daily Session Note  Patient Details  Name: John Wilkerson MRN: [id number] Date of Birth: 1960-09-06 Referring Provider:   Flowsheet Row CARDIAC REHAB PHASE II ORIENTATION from 12/29/2020 in [place]  Referring Provider Dr. Saunders Revel       Encounter Date: 02/11/2021  Check In:  Session Check In - 02/11/21 1058       Check-In   Supervising physician immediately available to respond to emergencies [facility] MD immediately available    Physician(s) Dr. Domenic Polite    Location [facility]    Staff Present Hoy Register, MS, ACSM-CEP, Exercise Physiologist;Doctor Sheahan Otho Ket, BS, Exercise Physiologist;Debra Wynetta Emery, RN, BSN    Virtual Visit No    Medication changes reported     No    Fall or balance concerns reported    No    Tobacco Cessation No Change    Warm-up and Cool-down Performed as group-led instruction    Resistance Training Performed Yes    VAD Patient? No    PAD/SET Patient? No      Pain Assessment   Currently in Pain? No/denies    Multiple Pain Sites No             Capillary Blood Glucose: No results found for this or any previous visit (from the past 24 hour(s)).    Social History   Tobacco Use  Smoking Status Former   Packs/day: 1.00   Types: Cigarettes  Smokeless Tobacco Never    Goals Met:  Independence with exercise equipment Exercise tolerated well No report of concerns or symptoms today Strength training completed today  Goals Unmet:  Not Applicable  Comments: check out 1200   Dr. Kathie Dike is Medical Director for [name] Pulmonary Rehab.

## 2021-02-13 ENCOUNTER — Encounter (HOSPITAL_COMMUNITY): Payer: No Typology Code available for payment source

## 2021-02-13 ENCOUNTER — Encounter (HOSPITAL_COMMUNITY)
Admission: RE | Admit: 2021-02-13 | Discharge: 2021-02-13 | Disposition: A | Discharge status: Home / Self Care | Payer: No Typology Code available for payment source | Source: Ambulatory Visit | Attending: Cardiology | Admitting: Cardiology

## 2021-02-13 DIAGNOSIS — I2111 ST elevation (STEMI) myocardial infarction involving right coronary artery: Secondary | ICD-10-CM | POA: Diagnosis not present

## 2021-02-13 DIAGNOSIS — Z955 Presence of coronary angioplasty implant and graft: Secondary | ICD-10-CM | POA: Diagnosis present

## 2021-02-13 NOTE — Progress Notes (Signed)
Daily Session Note  Patient Details  Name: John Wilkerson MRN: [id number] Date of Birth: 1961-03-14 Referring Provider:   Flowsheet Row CARDIAC REHAB PHASE II ORIENTATION from 12/29/2020 in [place]  Referring Provider Dr. Saunders Revel       Encounter Date: 02/13/2021  Check In:  Session Check In - 02/13/21 1058       Check-In   Supervising physician immediately available to respond to emergencies [facility] MD immediately available    Physician(s) Dr. Domenic Polite    Location [facility]    Staff Present Hoy Register, MS, ACSM-CEP, Exercise Physiologist;Kadir Azucena Otho Ket, BS, Exercise Physiologist;Debra Wynetta Emery, RN, BSN    Virtual Visit No    Medication changes reported     No    Fall or balance concerns reported    No    Tobacco Cessation No Change    Warm-up and Cool-down Performed as group-led instruction    Resistance Training Performed Yes    VAD Patient? No    PAD/SET Patient? No      Pain Assessment   Currently in Pain? No/denies    Multiple Pain Sites No             Capillary Blood Glucose: No results found for this or any previous visit (from the past 24 hour(s)).    Social History   Tobacco Use  Smoking Status Former   Packs/day: 1.00   Types: Cigarettes  Smokeless Tobacco Never    Goals Met:  Independence with exercise equipment Exercise tolerated well No report of concerns or symptoms today Strength training completed today  Goals Unmet:  Not Applicable  Comments: check out 1200   Dr. Kathie Dike is Medical Director for [facility] Pul[facility] Re[facility].

## 2021-02-16 ENCOUNTER — Encounter (HOSPITAL_COMMUNITY)
Admission: RE | Admit: 2021-02-16 | Discharge: 2021-02-16 | Disposition: A | Discharge status: Home / Self Care | Payer: No Typology Code available for payment source | Source: Ambulatory Visit | Attending: Cardiology | Admitting: Cardiology

## 2021-02-16 ENCOUNTER — Encounter (HOSPITAL_COMMUNITY): Payer: No Typology Code available for payment source

## 2021-02-16 ENCOUNTER — Other Ambulatory Visit: Payer: Self-pay

## 2021-02-16 VITALS — Wt 221.6 lb

## 2021-02-16 DIAGNOSIS — Z955 Presence of coronary angioplasty implant and graft: Secondary | ICD-10-CM

## 2021-02-16 DIAGNOSIS — I2111 ST elevation (STEMI) myocardial infarction involving right coronary artery: Secondary | ICD-10-CM

## 2021-02-16 NOTE — Progress Notes (Signed)
Daily Session Note  Patient Details  Name: John Wilkerson MRN: [id number] Date of Birth: 04/16/1960 Referring Provider:   Flowsheet Row CARDIAC REHAB PHASE II ORIENTATION from 12/29/2020 in [place]  Referring Provider Dr. Saunders Revel       Encounter Date: 02/16/2021  Check In:  Session Check In - 02/16/21 1100       Check-In   Supervising physician immediately available to respond to emergencies [facility] MD immediately available    Physician(s) Dr. Harl Bowie    Location [facility]    Staff Present Hoy Register, MS, ACSM-CEP, Exercise Physiologist;Seairra Otani Zigmund Daniel, Exercise Physiologist;Debra Wynetta Emery, RN, BSN    Virtual Visit No    Medication changes reported     No    Fall or balance concerns reported    No    Tobacco Cessation No Change    Warm-up and Cool-down Performed as group-led instruction    Resistance Training Performed Yes    VAD Patient? No    PAD/SET Patient? No      Pain Assessment   Currently in Pain? No/denies    Multiple Pain Sites No             Capillary Blood Glucose: No results found for this or any previous visit (from the past 24 hour(s)).    Social History   Tobacco Use  Smoking Status Former   Packs/day: 1.00   Types: Cigarettes  Smokeless Tobacco Never    Goals Met:  Independence with exercise equipment Exercise tolerated well No report of concerns or symptoms today Strength training completed today  Goals Unmet:  Not Applicable  Comments: check out 1200   Dr. Kathie Dike is Medical Director for [facility] Pulmonary Rehab.

## 2021-02-18 ENCOUNTER — Encounter (HOSPITAL_COMMUNITY)
Admission: RE | Admit: 2021-02-18 | Discharge: 2021-02-18 | Disposition: A | Discharge status: Home / Self Care | Payer: No Typology Code available for payment source | Source: Ambulatory Visit | Attending: Cardiology | Admitting: Cardiology

## 2021-02-18 ENCOUNTER — Encounter (HOSPITAL_COMMUNITY): Payer: No Typology Code available for payment source

## 2021-02-18 DIAGNOSIS — Z955 Presence of coronary angioplasty implant and graft: Secondary | ICD-10-CM

## 2021-02-18 DIAGNOSIS — I2111 ST elevation (STEMI) myocardial infarction involving right coronary artery: Secondary | ICD-10-CM | POA: Diagnosis not present

## 2021-02-18 NOTE — Progress Notes (Signed)
Cardiac Individual Treatment Plan  Patient Details  Name: John Wilkerson MRN: [id number] Date of Birth: 1960-07-06 Referring Provider:   Flowsheet Row CARDIAC REHAB PHASE II ORIENTATION from 12/29/2020 in [facility]  Referring Provider Dr. Saunders Revel       Initial Encounter Date:  Flowsheet Row CARDIAC REHAB PHASE II ORIENTATION from 12/29/2020 in [facility]  Date 12/29/20       Visit Diagnosis: ST elevation myocardial infarction involving right coronary artery [facility])  Status post coronary artery stent placement  Patient's Home Medications on Admission:  Current Outpatient Medications:    amLODipine (NORVASC) 10 MG tablet, Take 1 tablet (10 mg total) by mouth daily., Disp: 30 tablet, Rfl: 11   aspirin 81 MG chewable tablet, Chew 1 tablet (81 mg total) by mouth daily., Disp: 90 tablet, Rfl: 3   atorvastatin (LIPITOR) 80 MG tablet, Take 1 tablet (80 mg total) by mouth daily., Disp: 30 tablet, Rfl: 11   carvedilol (COREG) 12.5 MG tablet, Take 1 tablet (12.5 mg total) by mouth 2 (two) times daily with a meal., Disp: 60 tablet, Rfl: 11   empagliflozin (JARDIANCE) 10 MG TABS tablet, Take 1 tablet (10 mg total) by mouth daily., Disp: 30 tablet, Rfl: 11   escitalopram (LEXAPRO) 5 MG tablet, Take 5 mg by mouth daily., Disp: , Rfl:    Multiple Vitamin (MULTIVITAMIN ADULT PO), Take 1 tablet by mouth daily., Disp: , Rfl:    nitroGLYCERIN (NITROSTAT) 0.4 MG SL tablet, Place 1 tablet (0.4 mg total) under the tongue every 5 (five) minutes x 3 doses as needed for chest pain., Disp: 100 tablet, Rfl: 1   Omega-3 Fatty Acids (FISH OIL OMEGA-3 PO), Take 2 capsules by mouth daily., Disp: , Rfl:    ticagrelor (BRILINTA) 90 MG TABS tablet, Take 1 tablet (90 mg total) by mouth 2 (two) times daily., Disp: 60 tablet, Rfl: 11   Turmeric (QC TUMERIC COMPLEX PO), Take 1 tablet by mouth daily., Disp: , Rfl:   Past Medical History: Past Medical History:  Diagnosis Date    Hiatal hernia     Tobacco Use: Social History   Tobacco Use  Smoking Status Former   Packs/day: 1.00   Types: Cigarettes  Smokeless Tobacco Never    Labs: Recent Review Flowsheet Data     Labs for ITP Cardiac and Pulmonary Rehab Latest Ref Rng & Units 08/16/2020 08/16/2020 08/18/2020 08/18/2020   Cholestrol 0 - 200 mg/dL 223(H) - - -   LDLCALC 0 - 99 mg/dL 153(H) - - -   HDL >40 mg/dL 34(L) - - -   Trlycerides <150 mg/dL 180(H) - - -   Hemoglobin A1c 4.8 - 5.6 % 5.6 5.6 - -   PHART 7.350 - 7.450 - - - 7.481(H)   PCO2ART 32.0 - 48.0 mmHg - - - 30.3(L)   HCO3 20.0 - 28.0 mmol/L - - 25.7 22.7   TCO2 22 - 32 mmol/L - - - 24   O2SAT % - - 41.1 93.0       Capillary Blood Glucose: Lab Results  Component Value Date   GLUCAP 117 (H) 01/05/2021   GLUCAP 138 (H) 01/02/2021   GLUCAP 97 12/31/2020   GLUCAP 88 12/31/2020   GLUCAP 102 (H) 12/29/2020    POCT Glucose     Row Name 12/29/20 1332 01/05/21 1313           POCT Blood Glucose   Pre-Exercise 102 mg/dL --  Pre-Exercise #2 -- 117 mg/dL               Exercise Target Goals: Exercise Program Goal: Individual exercise prescription set using results from initial 6 min walk test and THRR while considering  patient's activity barriers and safety.   Exercise Prescription Goal: Starting with aerobic activity 30 plus minutes a day, 3 days per week for initial exercise prescription. Provide home exercise prescription and guidelines that participant acknowledges understanding prior to discharge.  Activity Barriers & Risk Stratification:  Activity Barriers & Cardiac Risk Stratification - 12/29/20 1300       Activity Barriers & Cardiac Risk Stratification   Activity Barriers Arthritis;Back Problems    Cardiac Risk Stratification High             6 Minute Walk:  6 Minute Walk     Row Name 12/29/20 1404         6 Minute Walk   Phase Initial     Distance 1250 feet     Walk Time 6 minutes     # of Rest  Breaks 0     MPH 2.4     METS 3.04     RPE 14     VO2 Peak 10.65     Symptoms No     Resting HR 63 bpm     Resting BP 126/64     Resting Oxygen Saturation  96 %     Exercise Oxygen Saturation  during 6 min walk 96 %     Max Ex. HR 75 bpm     Max Ex. BP 145/75     2 Minute Post BP 130/70              Oxygen Initial Assessment:   Oxygen Re-Evaluation:   Oxygen Discharge (Final Oxygen Re-Evaluation):   Initial Exercise Prescription:  Initial Exercise Prescription - 12/29/20 1400       Date of Initial Exercise RX and Referring Provider   Date 12/29/20    Referring Provider Dr. Saunders Revel    Expected Discharge Date 03/23/21      Treadmill   MPH 1.7    Grade 0    Minutes 17      NuStep   Level 1    SPM 70    Minutes 22      Prescription Details   Frequency (times per week) 3    Duration Progress to 30 minutes of continuous aerobic without signs/symptoms of physical distress      Intensity   THRR 40-80% of Max Heartrate 64-127    Ratings of Perceived Exertion 11-15    Perceived Dyspnea 0-4      Resistance Training   Training Prescription Yes    Weight 3    Reps 10-15             Perform Capillary Blood Glucose checks as needed.  Exercise Prescription Changes:   Exercise Prescription Changes     Row Name 01/05/21 1300 01/16/21 1100 01/19/21 1300 02/02/21 1200 02/16/21 1153     Response to Exercise   Blood Pressure (Admit) 128/62 -- 130/68 124/70 130/76   Blood Pressure (Exercise) 128/68 -- 140/60 126/64 132/70   Blood Pressure (Exit) 128/70 -- 120/65 112/64 110/60   Heart Rate (Admit) 60 bpm -- 70 bpm 71 bpm 72 bpm   Heart Rate (Exercise) 79 bpm -- 89 bpm 96 bpm 82 bpm   Heart Rate (Exit) 69 bpm -- 79 bpm 79 bpm 79 bpm  Rating of Perceived Exertion (Exercise) 16 -- 14 14 14    Duration Continue with 30 min of aerobic exercise without signs/symptoms of physical distress. -- Continue with 30 min of aerobic exercise without signs/symptoms of physical  distress. Continue with 30 min of aerobic exercise without signs/symptoms of physical distress. Continue with 30 min of aerobic exercise without signs/symptoms of physical distress.   Intensity THRR unchanged -- THRR unchanged THRR unchanged THRR unchanged     Progression   Progression -- -- Continue to progress workloads to maintain intensity without signs/symptoms of physical distress. Continue to progress workloads to maintain intensity without signs/symptoms of physical distress. Continue to progress workloads to maintain intensity without signs/symptoms of physical distress.     Resistance Training   Training Prescription Yes -- Yes Yes Yes   Weight 4 lbs -- 4 lbs 4 lbs 4 lbs   Reps 10-15 -- 10-15 10-15 10-15   Time 10 Minutes -- 10 Minutes 10 Minutes 10 Minutes     Treadmill   MPH 1.7 -- 1.7 1.7 1.7   Grade 0 -- 0 0 0   Minutes 22 -- 22 22 22    METs 2.3 -- 2.3 2.3 2.3     NuStep   Level 1 -- 1 1 1    SPM 86 -- 95 104 100   Minutes 17 -- 17 17 17    METs 2.11 -- 2.23 2.38 2.25     Home Exercise Plan   Plans to continue exercise at -- Home (comment) -- -- --   Frequency -- Add 2 additional days to program exercise sessions. -- -- --   Initial Home Exercises Provided -- 01/16/21 -- -- --            Exercise Comments:   Exercise Comments     Row Name 01/16/21 1141           Exercise Comments home exercise reviewed                Exercise Goals and Review:   Exercise Goals     Row Name 12/29/20 1408 01/19/21 1317 02/16/21 1154         Exercise Goals   Increase Physical Activity Yes Yes Yes     Intervention Provide advice, education, support and counseling about physical activity/exercise needs.;Develop an individualized exercise prescription for aerobic and resistive training based on initial evaluation findings, risk stratification, comorbidities and participant's personal goals. -- Provide advice, education, support and counseling about physical  activity/exercise needs.;Develop an individualized exercise prescription for aerobic and resistive training based on initial evaluation findings, risk stratification, comorbidities and participant's personal goals.     Expected Outcomes Short Term: Attend rehab on a regular basis to increase amount of physical activity.;Long Term: Add in home exercise to make exercise part of routine and to increase amount of physical activity.;Long Term: Exercising regularly at least 3-5 days a week. Short Term: Attend rehab on a regular basis to increase amount of physical activity.;Long Term: Add in home exercise to make exercise part of routine and to increase amount of physical activity.;Long Term: Exercising regularly at least 3-5 days a week. Short Term: Attend rehab on a regular basis to increase amount of physical activity.;Long Term: Add in home exercise to make exercise part of routine and to increase amount of physical activity.;Long Term: Exercising regularly at least 3-5 days a week.     Increase Strength and Stamina Yes Yes --     Intervention Provide advice, education, support and  counseling about physical activity/exercise needs.;Develop an individualized exercise prescription for aerobic and resistive training based on initial evaluation findings, risk stratification, comorbidities and participant's personal goals. Provide advice, education, support and counseling about physical activity/exercise needs.;Develop an individualized exercise prescription for aerobic and resistive training based on initial evaluation findings, risk stratification, comorbidities and participant's personal goals. Provide advice, education, support and counseling about physical activity/exercise needs.;Develop an individualized exercise prescription for aerobic and resistive training based on initial evaluation findings, risk stratification, comorbidities and participant's personal goals.     Expected Outcomes Short Term: Increase  workloads from initial exercise prescription for resistance, speed, and METs.;Short Term: Perform resistance training exercises routinely during rehab and add in resistance training at home;Long Term: Improve cardiorespiratory fitness, muscular endurance and strength as measured by increased METs and functional capacity ( ) Short Term: Increase workloads from initial exercise prescription for resistance, speed, and METs.;Short Term: Perform resistance training exercises routinely during rehab and add in resistance training at home;Long Term: Improve cardiorespiratory fitness, muscular endurance and strength as measured by increased METs and functional capacity ( ) Short Term: Increase workloads from initial exercise prescription for resistance, speed, and METs.;Short Term: Perform resistance training exercises routinely during rehab and add in resistance training at home;Long Term: Improve cardiorespiratory fitness, muscular endurance and strength as measured by increased METs and functional capacity ( )     Able to understand and use rate of perceived exertion (RPE) scale Yes Yes Yes     Intervention Provide education and explanation on how to use RPE scale Provide education and explanation on how to use RPE scale Provide education and explanation on how to use RPE scale     Expected Outcomes Short Term: Able to use RPE daily in rehab to express subjective intensity level;Long Term:  Able to use RPE to guide intensity level when exercising independently Short Term: Able to use RPE daily in rehab to express subjective intensity level;Long Term:  Able to use RPE to guide intensity level when exercising independently Short Term: Able to use RPE daily in rehab to express subjective intensity level;Long Term:  Able to use RPE to guide intensity level when exercising independently     Knowledge and understanding of Target Heart Rate Range (THRR) Yes Yes Yes     Intervention Provide education and explanation  of THRR including how the numbers were predicted and where they are located for reference Provide education and explanation of THRR including how the numbers were predicted and where they are located for reference Provide education and explanation of THRR including how the numbers were predicted and where they are located for reference     Expected Outcomes Short Term: Able to state/look up THRR;Short Term: Able to use daily as guideline for intensity in rehab;Long Term: Able to use THRR to govern intensity when exercising independently Short Term: Able to state/look up THRR;Short Term: Able to use daily as guideline for intensity in rehab;Long Term: Able to use THRR to govern intensity when exercising independently Short Term: Able to state/look up THRR;Short Term: Able to use daily as guideline for intensity in rehab;Long Term: Able to use THRR to govern intensity when exercising independently     Able to check pulse independently Yes Yes Yes     Intervention Provide education and demonstration on how to check pulse in carotid and radial arteries.;Review the importance of being able to check your own pulse for safety during independent exercise Provide education and demonstration on how to check pulse in carotid and  radial arteries.;Review the importance of being able to check your own pulse for safety during independent exercise Provide education and demonstration on how to check pulse in carotid and radial arteries.;Review the importance of being able to check your own pulse for safety during independent exercise     Expected Outcomes Short Term: Able to explain why pulse checking is important during independent exercise;Long Term: Able to check pulse independently and accurately Short Term: Able to explain why pulse checking is important during independent exercise;Long Term: Able to check pulse independently and accurately Short Term: Able to explain why pulse checking is important during independent  exercise;Long Term: Able to check pulse independently and accurately     Understanding of Exercise Prescription Yes Yes Yes     Intervention Provide education, explanation, and written materials on patient's individual exercise prescription Provide education, explanation, and written materials on patient's individual exercise prescription Provide education, explanation, and written materials on patient's individual exercise prescription     Expected Outcomes Short Term: Able to explain program exercise prescription;Long Term: Able to explain home exercise prescription to exercise independently Short Term: Able to explain program exercise prescription;Long Term: Able to explain home exercise prescription to exercise independently Short Term: Able to explain program exercise prescription;Long Term: Able to explain home exercise prescription to exercise independently              Exercise Goals Re-Evaluation :  Exercise Goals Re-Evaluation     Row Name 01/19/21 1317 02/17/21 0745           Exercise Goal Re-Evaluation   Exercise Goals Review Increase Physical Activity;Increase Strength and Stamina;Able to understand and use rate of perceived exertion (RPE) scale;Knowledge and understanding of Target Heart Rate Range (THRR);Able to check pulse independently;Understanding of Exercise Prescription Increase Physical Activity;Increase Strength and Stamina;Able to understand and use rate of perceived exertion (RPE) scale;Knowledge and understanding of Target Heart Rate Range (THRR);Able to check pulse independently;Understanding of Exercise Prescription      Comments Pt has completed 10 sessions of cardiac rehab. He is limited by hip pain and sometimes he is unable to walk his normal speed on the treadmill due to the pain. He is beginning to get his stamina back and is beginning to progress. He is currently exercising at 2.3 METs. Will continue to monitor and progress as able. Pt has completed 21 sessions  of cardiac rehab. His continues to be limited by hip pain while walkingnon the treadmill and unable to walk normal speed or increase speed. Pt could progress workload on the stepper. He is currently exercising at 2.30 METs on the treadmill. Will continue to monitor and progress as able.      Expected Outcomes Through exercise at rehab and at home, the patient will meet their stated goals. Through exercise at rehab and at home, the patient will meet their stated goals.                Discharge Exercise Prescription (Final Exercise Prescription Changes):  Exercise Prescription Changes - 02/16/21 1153       Response to Exercise   Blood Pressure (Admit) 130/76    Blood Pressure (Exercise) 132/70    Blood Pressure (Exit) 110/60    Heart Rate (Admit) 72 bpm    Heart Rate (Exercise) 82 bpm    Heart Rate (Exit) 79 bpm    Rating of Perceived Exertion (Exercise) 14    Duration Continue with 30 min of aerobic exercise without signs/symptoms of physical distress.  Intensity THRR unchanged      Progression   Progression Continue to progress workloads to maintain intensity without signs/symptoms of physical distress.      Resistance Training   Training Prescription Yes    Weight 4 lbs    Reps 10-15    Time 10 Minutes      Treadmill   MPH 1.7    Grade 0    Minutes 22    METs 2.3      NuStep   Level 1    SPM 100    Minutes 17    METs 2.25             Nutrition:  Target Goals: Understanding of nutrition guidelines, daily intake of sodium 1500mg , cholesterol 200mg , calories 30% from fat and 7% or less from saturated fats, daily to have 5 or more servings of fruits and vegetables.  Biometrics:  Pre Biometrics - 12/29/20 1408       Pre Biometrics   Height 5\' 11"  (1.803 m)    Weight 97.4 kg    Waist Circumference 43 inches    Hip Circumference 43 inches    Waist to Hip Ratio 1 %    BMI (Calculated) 29.96    Triceps Skinfold 11 mm    % Body Fat 28 %    Grip Strength  38.9 kg    Flexibility 4 in    Single Leg Stand 4 seconds              Nutrition Therapy Plan and Nutrition Goals:  Nutrition Therapy & Goals - 02/11/21 1250       Personal Nutrition Goals   Comments Patient scored 25 on his diet assessment. We offer 2 educational sessions on heart healthy nutrition with handouts.      Intervention Plan   Intervention Nutrition handout(s) given to patient.    Expected Outcomes Short Term Goal: Understand basic principles of dietary content, such as calories, fat, sodium, cholesterol and nutrients.             Nutrition Assessments:  Nutrition Assessments - 12/29/20 1304       MEDFICTS Scores   Pre Score 25            MEDIFICTS Score Key: ?70 Need to make dietary changes  40-70 Heart Healthy Diet ? 40 Therapeutic Level Cholesterol Diet   Picture Your Plate Scores: [id number] Unhealthy dietary pattern with much room for improvement. 41-50 Dietary pattern unlikely to meet recommendations for good health and room for improvement. 51-60 More healthful dietary pattern, with some room for improvement.  >60 Healthy dietary pattern, although there may be some specific behaviors that could be improved.    Nutrition Goals Re-Evaluation:   Nutrition Goals Discharge (Final Nutrition Goals Re-Evaluation):   Psychosocial: Target Goals: Acknowledge presence or absence of significant depression and/or stress, maximize coping skills, provide positive support system. Participant is able to verbalize types and ability to use techniques and skills needed for reducing stress and depression.  Initial Review & Psychosocial Screening:  Initial Psych Review & Screening - 12/29/20 1301       Initial Review   Current issues with Current Anxiety/Panic   escitalopram recently increased to 10 mg daily     Family Dynamics   Good Support System? Yes    Comments His support system includes his three children and his wife.      Barriers   Psychosocial  barriers to participate in program There are no identifiable barriers  or psychosocial needs.;The patient should benefit from training in stress management and relaxation.      Screening Interventions   Interventions Encouraged to exercise    Expected Outcomes Long Term Goal: Stressors or current issues are controlled or eliminated.;Short Term goal: Identification and review with participant of any Quality of Life or Depression concerns found by scoring the questionnaire.;Long Term goal: The participant improves quality of Life and PHQ9 Scores as seen by post scores and/or verbalization of changes             Quality of Life Scores:  Quality of Life - 12/29/20 1409       Quality of Life   Select Quality of Life      Quality of Life Scores   Health/Function Pre 22 %    Socioeconomic Pre 18 %    Psych/Spiritual Pre 16.36 %    Family Pre 27.6 %    GLOBAL Pre 20.84 %            Scores of 19 and below usually indicate a poorer quality of life in these areas.  A difference of  2-3 points is a clinically meaningful difference.  A difference of 2-3 points in the total score of the Quality of Life Index has been associated with significant improvement in overall quality of life, self-image, physical symptoms, and general health in studies assessing change in quality of life.  PHQ-9: Recent Review Flowsheet Data     Depression screen [facility] 2/9 12/29/2020   Decreased Interest 0   Down, Depressed, Hopeless 0   PHQ - 2 Score 0   Altered sleeping 1   Tired, decreased energy 2   Change in appetite 0   Feeling bad or failure about yourself  1   Trouble concentrating 0   Moving slowly or fidgety/restless 1   Suicidal thoughts 0   PHQ-9 Score 5   Difficult doing work/chores Not difficult at all      Interpretation of Total Score  Total Score Depression Severity:  1-4 = Minimal depression, 5-9 = Mild depression, 10-14 = Moderate depression, 15-19 = Moderately severe depression, 20-27  = Severe depression   Psychosocial Evaluation and Intervention:  Psychosocial Evaluation - 12/29/20 1344       Psychosocial Evaluation & Interventions   Interventions Encouraged to exercise with the program and follow exercise prescription    Comments Pt has no barriers to completing rehab. Pt has no identifiable psychosocial issues. He does take lexapro which helps him with anxiety and sleep. He biggest issue right now is his lack of energy since his STEMI and stents. He reports that he has a good support system with his wife and his children and copes well with his problems. He has lost over 20 lbs since his was in the hospital by cleaning up his diet. He hopes to lose about 10 more lbs while in the program in order to weight less than 200 lbs. His other goals include gaining his strength and stamina back. He is eager to start the program.    Expected Outcomes Pt will continue to take lexapro for anxiety and will not have any other identifiable psychosocial issues.    Continue Psychosocial Services  No Follow up required             Psychosocial Re-Evaluation:  Psychosocial Re-Evaluation     [place] Name 01/14/21 1404 02/09/21 1335           Psychosocial Re-Evaluation   Current issues with  Current Anxiety/Panic Current Anxiety/Panic      Comments Patient is new to the program completing 7 sessions. He continues to have no psychosocial barriers identified. His anxiety is managed with Lexapro. He seems to enjoy coming to the program and demonstrates an interest in improving his health. We will continue to monitor for progress. Patient is new to the program completing 18 sessions. He continues to have no psychosocial barriers identified. His anxiety continues to be managed with Lexapro. He continues to enjoy coming to the program and demonstrates an interest in improving his health. We will continue to monitor for progress.      Expected Outcomes Patient will continue to have no psychosocial  barriers identified. Patient will continue to have no psychosocial barriers identified.      Interventions Stress management education;Encouraged to attend Cardiac Rehabilitation for the exercise;Relaxation education Stress management education;Encouraged to attend Cardiac Rehabilitation for the exercise;Relaxation education      Continue Psychosocial Services  No Follow up required No Follow up required               Psychosocial Discharge (Final Psychosocial Re-Evaluation):  Psychosocial Re-Evaluation - 02/09/21 1335       Psychosocial Re-Evaluation   Current issues with Current Anxiety/Panic    Comments Patient is new to the program completing 18 sessions. He continues to have no psychosocial barriers identified. His anxiety continues to be managed with Lexapro. He continues to enjoy coming to the program and demonstrates an interest in improving his health. We will continue to monitor for progress.    Expected Outcomes Patient will continue to have no psychosocial barriers identified.    Interventions Stress management education;Encouraged to attend Cardiac Rehabilitation for the exercise;Relaxation education    Continue Psychosocial Services  No Follow up required             Vocational Rehabilitation: Provide vocational rehab assistance to qualifying candidates.   Vocational Rehab Evaluation & Intervention:  Vocational Rehab - 12/29/20 1318       Initial Vocational Rehab Evaluation & Intervention   Assessment shows need for Vocational Rehabilitation No             Education: Education Goals: Education classes will be provided on a weekly basis, covering required topics. Participant will state understanding/return demonstration of topics presented.  Learning Barriers/Preferences:  Learning Barriers/Preferences - 12/29/20 1315       Learning Barriers/Preferences   Learning Barriers None    Learning Preferences Audio;Computer/Internet;Group Instruction;Skilled  Demonstration;Pictoral;Individual Instruction;Verbal Instruction;Video;Written Material             Education Topics: Hypertension, Hypertension Reduction -Define heart disease and high blood pressure. Discus how high blood pressure affects the body and ways to reduce high blood pressure.   Exercise and Your Heart -Discuss why it is important to exercise, the FITT principles of exercise, normal and abnormal responses to exercise, and how to exercise safely.   Angina -Discuss definition of angina, causes of angina, treatment of angina, and how to decrease risk of having angina. Flowsheet Row CARDIAC REHAB PHASE II EXERCISE from 02/04/2021 in [place]  Date 12/31/20  Educator [place]  Instruction Review Code 2- Demonstrated Understanding       Cardiac Medications -Review what the following cardiac medications are used for, how they affect the body, and side effects that may occur when taking the medications.  Medications include Aspirin, Beta blockers, calcium channel blockers, ACE Inhibitors, angiotensin receptor blockers, diuretics, digoxin, and antihyperlipidemics. [place]  REHAB PHASE II EXERCISE from 02/04/2021 in [place]  Date 01/07/21  Educator DF  Instruction Review Code 2- Demonstrated Understanding       Congestive Heart Failure -Discuss the definition of CHF, how to live with CHF, the signs and symptoms of CHF, and how keep track of weight and sodium intake. Flowsheet Row CARDIAC REHAB PHASE II EXERCISE from 02/04/2021 in [place]  Date 01/14/21  Educator DJ  Instruction Review Code 1- Verbalizes Understanding       Heart Disease and Intimacy -Discus the effect sexual activity has on the heart, how changes occur during intimacy as we age, and safety during sexual activity. Flowsheet Row CARDIAC REHAB PHASE II EXERCISE from 02/04/2021 in [place]  Date  01/21/21  Educator DF  Instruction Review Code 2- Demonstrated Understanding       Smoking Cessation / COPD -Discuss different methods to quit smoking, the health benefits of quitting smoking, and the definition of COPD. Flowsheet Row CARDIAC REHAB PHASE II EXERCISE from 02/04/2021 in [place]  Date 01/28/21  Educator DJ  Instruction Review Code 1- Verbalizes Understanding       Nutrition I: Fats -Discuss the types of cholesterol, what cholesterol does to the heart, and how cholesterol levels can be controlled. Flowsheet Row CARDIAC REHAB PHASE II EXERCISE from 02/04/2021 in [place]  Date 02/04/21  Educator DF  Instruction Review Code 2- Demonstrated Understanding       Nutrition II: Labels -Discuss the different components of food labels and how to read food label   Heart Parts/Heart Disease and PAD -Discuss the anatomy of the heart, the pathway of blood circulation through the heart, and these are affected by heart disease.   Stress I: Signs and Symptoms -Discuss the causes of stress, how stress may lead to anxiety and depression, and ways to limit stress.   Stress II: Relaxation -Discuss different types of relaxation techniques to limit stress.   Warning Signs of Stroke / TIA -Discuss definition of a stroke, what the signs and symptoms are of a stroke, and how to identify when someone is having stroke.   Knowledge Questionnaire Score:  Knowledge Questionnaire Score - 12/29/20 1315       Knowledge Questionnaire Score   Pre Score 23/28             Core Components/Risk Factors/Patient Goals at Admission:  Personal Goals and Risk Factors at Admission - 12/29/20 1318       Core Components/Risk Factors/Patient Goals on Admission    Weight Management Yes;Obesity;Weight Loss    Intervention Weight Management: Develop a combined nutrition and exercise program designed to reach desired caloric intake, while  maintaining appropriate intake of nutrient and fiber, sodium and fats, and appropriate energy expenditure required for the weight goal.;Weight Management: Provide education and appropriate resources to help participant work on and attain dietary goals.;Weight Management/Obesity: Establish reasonable short term and long term weight goals.;Obesity: Provide education and appropriate resources to help participant work on and attain dietary goals.    Admit Weight 210 lb (95.3 kg)    Goal Weight: Short Term 200 lb (90.7 kg)    Expected Outcomes Short Term: Continue to assess and modify interventions until short term weight is achieved;Long Term: Adherence to nutrition and physical activity/exercise program aimed toward attainment of established weight goal;Weight Maintenance: Understanding of the daily nutrition guidelines, which includes 25-35% calories from fat, 7% or less cal from saturated fats,  less than 200mg  cholesterol, less than 1.5gm of sodium, & 5 or more servings of fruits and vegetables daily;Weight Loss: Understanding of general recommendations for a balanced deficit meal plan, which promotes 1-2 lb weight loss per week and includes a negative energy balance of [phone] kcal/d;Understanding recommendations for meals to include 15-35% energy as protein, 25-35% energy from fat, 35-60% energy from carbohydrates, less than 200mg  of dietary cholesterol, 20-35 gm of total fiber daily;Understanding of distribution of calorie intake throughout the day with the consumption of 4-5 meals/snacks    Improve shortness of breath with ADL's Yes    Intervention Provide education, individualized exercise plan and daily activity instruction to help decrease symptoms of SOB with activities of daily living.    Expected Outcomes Short Term: Improve cardiorespiratory fitness to achieve a reduction of symptoms when performing ADLs;Long Term: Be able to perform more ADLs without symptoms or delay the onset of symptoms              Core Components/Risk Factors/Patient Goals Review:   Goals and Risk Factor Review     Row Name 01/14/21 1406 02/09/21 1336           Core Components/Risk Factors/Patient Goals Review   Personal Goals Review Weight Management/Obesity Weight Management/Obesity      Review Patient was referred to CR with STEMI and S/P Stent. He has multiple risk factors for CAD and is participating in the program for risk modification. He has not been diagnosed with DM but is taking on Jardiance for HF. His last A1C was 08/16/20 at 5.6%. His blood pressure is well controlled. His personal goals for the program are to lose 10 lbs; increase his strenght and stamina and increase his energy level. We will continue to monitor his progress as he works towards meeting these goals. Patient has completed 18 sessions gaining 3 lbs since last 30 day review. He is doing well in the program with consistent attendance and progression. His blood pressure is at goal of below 130/80. He had ECHO 10/27 with EF of 55%. Cardiology says he wants b/p below 130/80. Patient's activity in CR is sometimes limited by his hip pain. His personal goals continue to be to lose 10 lbs and increase his strength and stamina. We will continue to monitor his progress as he works towards meeting these goals.      Expected Outcomes Patient will complete the program meeting both personal and program goals. Patient will complete the program meeting both personal and program goals.               Core Components/Risk Factors/Patient Goals at Discharge (Final Review):   Goals and Risk Factor Review - 02/09/21 1336       Core Components/Risk Factors/Patient Goals Review   Personal Goals Review Weight Management/Obesity    Review Patient has completed 18 sessions gaining 3 lbs since last 30 day review. He is doing well in the program with consistent attendance and progression. His blood pressure is at goal of below 130/80. He had ECHO 10/27 with  EF of 55%. Cardiology says he wants b/p below 130/80. Patient's activity in CR is sometimes limited by his hip pain. His personal goals continue to be to lose 10 lbs and increase his strength and stamina. We will continue to monitor his progress as he works towards meeting these goals.    Expected Outcomes Patient will complete the program meeting both personal and program goals.  ITP Comments:   Comments: ITP REVIEW Pt is making expected progress toward Cardiac Rehab goals after completing 21 sessions. Recommend continued exercise, life style modification, education, and increased stamina and strength.

## 2021-02-18 NOTE — Progress Notes (Signed)
Daily Session Note  Patient Details  Name: John Wilkerson MRN: [id number] Date of Birth: 02/14/1961 Referring Provider:   Flowsheet Row CARDIAC REHAB PHASE II ORIENTATION from 12/29/2020 in [place]  Referring Provider Dr. Saunders Revel       Encounter Date: 02/18/2021  Check In:  Session Check In - 02/18/21 1100       Check-In   Supervising physician immediately available to respond to emergencies [facility] MD immediately available    Physician(s) Dr. Harl Bowie    Location [facility]    Staff Present Hoy Register, MS, ACSM-CEP, Exercise Physiologist;Heather Zigmund Daniel, Exercise Physiologist;Katharin Schneider Wynetta Emery, RN, BSN    Virtual Visit No    Medication changes reported     No    Fall or balance concerns reported    No    Tobacco Cessation No Change    Warm-up and Cool-down Performed as group-led instruction    Resistance Training Performed Yes    VAD Patient? No    PAD/SET Patient? No      Pain Assessment   Currently in Pain? No/denies    Multiple Pain Sites No             Capillary Blood Glucose: No results found for this or any previous visit (from the past 24 hour(s)).    Social History   Tobacco Use  Smoking Status Former   Packs/day: 1.00   Types: Cigarettes  Smokeless Tobacco Never    Goals Met:  Independence with exercise equipment Exercise tolerated well No report of concerns or symptoms today Strength training completed today  Goals Unmet:  Not Applicable  Comments: Check out 1200.   Dr. Kathie Dike is Medical Director for [facility].

## 2021-02-20 ENCOUNTER — Encounter (HOSPITAL_COMMUNITY)
Admission: RE | Admit: 2021-02-20 | Discharge: 2021-02-20 | Disposition: A | Discharge status: Home / Self Care | Payer: No Typology Code available for payment source | Source: Ambulatory Visit | Attending: Cardiology | Admitting: Cardiology

## 2021-02-20 ENCOUNTER — Encounter (HOSPITAL_COMMUNITY): Payer: No Typology Code available for payment source

## 2021-02-20 DIAGNOSIS — Z955 Presence of coronary angioplasty implant and graft: Secondary | ICD-10-CM

## 2021-02-20 DIAGNOSIS — I2111 ST elevation (STEMI) myocardial infarction involving right coronary artery: Secondary | ICD-10-CM | POA: Diagnosis not present

## 2021-02-20 NOTE — Progress Notes (Signed)
Daily Session Note  Patient Details  Name: ABDULKADIR Wilkerson MRN: [id number] Date of Birth: [birth year] Referring Provider:   Flowsheet Row CARDIAC REHAB PHASE II ORIENTATION from 12/29/2020 in [place]  Referring Provider Dr. Saunders Revel       Encounter Date: 02/20/2021  Check In:  Session Check In - 02/20/21 1101       Check-In   Supervising physician immediately available to respond to emergencies [facility] MD immediately available    Physician(s) Dr. Harl Bowie    Location [facility]    Staff Present Geanie Cooley, RN;Debra Wynetta Emery, RN, Bjorn Loser, MS, ACSM-CEP, Exercise Physiologist    Virtual Visit No    Medication changes reported     No    Fall or balance concerns reported    No    Tobacco Cessation No Change    Warm-up and Cool-down Performed as group-led instruction    Resistance Training Performed Yes    VAD Patient? No    PAD/SET Patient? No      Pain Assessment   Currently in Pain? No/denies    Multiple Pain Sites No             Capillary Blood Glucose: No results found for this or any previous visit (from the past 24 hour(s)).    Social History   Tobacco Use  Smoking Status Former   Packs/day: 1.00   Types: Cigarettes  Smokeless Tobacco Never    Goals Met:  Independence with exercise equipment Exercise tolerated well No report of concerns or symptoms today Strength training completed today  Goals Unmet:  Not Applicable  Comments: check out @ 12:00pm   Dr. Kathie Dike is Medical Director for [facility] Pulmonary Re[facility].

## 2021-02-23 ENCOUNTER — Encounter (HOSPITAL_COMMUNITY): Payer: No Typology Code available for payment source

## 2021-02-23 ENCOUNTER — Encounter (HOSPITAL_COMMUNITY)
Admission: RE | Admit: 2021-02-23 | Discharge: 2021-02-23 | Disposition: A | Discharge status: Home / Self Care | Payer: No Typology Code available for payment source | Source: Ambulatory Visit | Attending: Cardiology | Admitting: Cardiology

## 2021-02-23 DIAGNOSIS — I2111 ST elevation (STEMI) myocardial infarction involving right coronary artery: Secondary | ICD-10-CM | POA: Diagnosis not present

## 2021-02-23 DIAGNOSIS — Z955 Presence of coronary angioplasty implant and graft: Secondary | ICD-10-CM

## 2021-02-23 NOTE — Progress Notes (Signed)
Daily Session Note  Patient Details  Name: John Wilkerson MRN: [id number] Date of Birth: 05/12/1960 Referring Provider:   Flowsheet Row CARDIAC REHAB PHASE II ORIENTATION from 12/29/2020 in [place]  Referring Provider Dr. Saunders Revel       Encounter Date: 02/23/2021  Check In:  Session Check In - 02/23/21 1105       Check-In   Supervising physician immediately available to respond to emergencies [facility] MD immediately available    Physician(s) Dr. Domenic Polite    Location [facility]    Staff Present Geanie Cooley, RN;Dalton Kris Mouton, MS, ACSM-CEP, Exercise Physiologist;Heather Zigmund Daniel, Exercise Physiologist;Tully Mcinturff Wynetta Emery, RN, BSN    Virtual Visit No    Medication changes reported     No    Fall or balance concerns reported    No    Tobacco Cessation No Change    Warm-up and Cool-down Performed as group-led instruction    Resistance Training Performed Yes    VAD Patient? No    PAD/SET Patient? No      Pain Assessment   Currently in Pain? No/denies    Multiple Pain Sites No             Capillary Blood Glucose: No results found for this or any previous visit (from the past 24 hour(s)).    Social History   Tobacco Use  Smoking Status Former   Packs/day: 1.00   Types: Cigarettes  Smokeless Tobacco Never    Goals Met:  Independence with exercise equipment Exercise tolerated well No report of concerns or symptoms today Strength training completed today  Goals Unmet:  Not Applicable  Comments: Check out 1200.   Dr. Kathie Dike is Medical Director for [facility].

## 2021-02-25 ENCOUNTER — Encounter (HOSPITAL_COMMUNITY)
Admission: RE | Admit: 2021-02-25 | Discharge: 2021-02-25 | Disposition: A | Discharge status: Home / Self Care | Payer: No Typology Code available for payment source | Source: Ambulatory Visit | Attending: Cardiology | Admitting: Cardiology

## 2021-02-25 ENCOUNTER — Encounter (HOSPITAL_COMMUNITY): Payer: No Typology Code available for payment source

## 2021-02-25 DIAGNOSIS — Z955 Presence of coronary angioplasty implant and graft: Secondary | ICD-10-CM

## 2021-02-25 DIAGNOSIS — I2111 ST elevation (STEMI) myocardial infarction involving right coronary artery: Secondary | ICD-10-CM

## 2021-02-25 NOTE — Progress Notes (Signed)
Daily Session Note  Patient Details  Name: John Wilkerson MRN: [id number] Date of Birth: 1960-10-19 Referring Provider:   Flowsheet Row CARDIAC REHAB PHASE II ORIENTATION from 12/29/2020 in [place]  Referring Provider Dr. Saunders Revel       Encounter Date: 02/25/2021  Check In:  Session Check In - 02/25/21 1100       Check-In   Supervising physician immediately available to respond to emergencies [facility] MD immediately available    Physician(s) Dr. Johney Frame    Location [facility]    Staff Present Geanie Cooley, RN;Heather Otho Ket, BS, Exercise Physiologist;Debra Wynetta Emery, RN, BSN    Virtual Visit No    Medication changes reported     No    Fall or balance concerns reported    No    Tobacco Cessation No Change    Warm-up and Cool-down Performed as group-led instruction    Resistance Training Performed Yes    VAD Patient? No    PAD/SET Patient? No      Pain Assessment   Currently in Pain? No/denies    Multiple Pain Sites No             Capillary Blood Glucose: No results found for this or any previous visit (from the past 24 hour(s)).    Social History   Tobacco Use  Smoking Status Former   Packs/day: 1.00   Types: Cigarettes  Smokeless Tobacco Never    Goals Met:  Independence with exercise equipment Exercise tolerated well No report of concerns or symptoms today Strength training completed today  Goals Unmet:  Not Applicable  Comments: check out @ 12:00pm   Dr. Kathie Dike is Medical Director for [facility].

## 2021-02-27 ENCOUNTER — Encounter (HOSPITAL_COMMUNITY): Payer: No Typology Code available for payment source

## 2021-02-28 ENCOUNTER — Emergency Department (HOSPITAL_COMMUNITY): Payer: No Typology Code available for payment source

## 2021-02-28 ENCOUNTER — Emergency Department (HOSPITAL_COMMUNITY)
Admission: EM | Admit: 2021-02-28 | Discharge: 2021-02-28 | Disposition: A | Discharge status: Home / Self Care | Payer: No Typology Code available for payment source | Attending: Emergency Medicine | Admitting: Emergency Medicine

## 2021-02-28 ENCOUNTER — Encounter (HOSPITAL_COMMUNITY): Payer: Self-pay | Admitting: *Deleted

## 2021-02-28 DIAGNOSIS — Z87891 Personal history of nicotine dependence: Secondary | ICD-10-CM | POA: Diagnosis not present

## 2021-02-28 DIAGNOSIS — I5043 Acute on chronic combined systolic (congestive) and diastolic (congestive) heart failure: Secondary | ICD-10-CM | POA: Diagnosis not present

## 2021-02-28 DIAGNOSIS — N182 Chronic kidney disease, stage 2 (mild): Secondary | ICD-10-CM | POA: Diagnosis not present

## 2021-02-28 DIAGNOSIS — R5383 Other fatigue: Secondary | ICD-10-CM | POA: Diagnosis present

## 2021-02-28 DIAGNOSIS — U071 COVID-19: Secondary | ICD-10-CM | POA: Insufficient documentation

## 2021-02-28 DIAGNOSIS — I130 Hypertensive heart and chronic kidney disease with heart failure and stage 1 through stage 4 chronic kidney disease, or unspecified chronic kidney disease: Secondary | ICD-10-CM | POA: Insufficient documentation

## 2021-02-28 LAB — CBC WITH DIFFERENTIAL/PLATELET
Abs Immature Granulocytes: 0.02 10*3/uL (ref 0.00–0.07)
Basophils Absolute: 0 10*3/uL (ref 0.0–0.1)
Basophils Relative: 0 %
Eosinophils Absolute: 0.1 10*3/uL (ref 0.0–0.5)
Eosinophils Relative: 1 %
HCT: 42.4 % (ref 39.0–52.0)
Hemoglobin: 13.9 g/dL (ref 13.0–17.0)
Immature Granulocytes: 0 %
Lymphocytes Relative: 15 %
Lymphs Abs: 0.7 10*3/uL (ref 0.7–4.0)
MCH: 29.1 pg (ref 26.0–34.0)
MCHC: 32.8 g/dL (ref 30.0–36.0)
MCV: 88.9 fL (ref 80.0–100.0)
Monocytes Absolute: 1 10*3/uL (ref 0.1–1.0)
Monocytes Relative: 20 %
Neutro Abs: 3.2 10*3/uL (ref 1.7–7.7)
Neutrophils Relative %: 64 %
Platelets: 215 10*3/uL (ref 150–400)
RBC: 4.77 MIL/uL (ref 4.22–5.81)
RDW: 14.7 % (ref 11.5–15.5)
WBC: 5 10*3/uL (ref 4.0–10.5)
nRBC: 0 % (ref 0.0–0.2)

## 2021-02-28 LAB — COMPREHENSIVE METABOLIC PANEL
ALT: 24 U/L (ref 0–44)
AST: 23 U/L (ref 15–41)
Albumin: 3.5 g/dL (ref 3.5–5.0)
Alkaline Phosphatase: 94 U/L (ref 38–126)
Anion gap: 9 (ref 5–15)
BUN: 17 mg/dL (ref 8–23)
CO2: 23 mmol/L (ref 22–32)
Calcium: 8.2 mg/dL — ABNORMAL LOW (ref 8.9–10.3)
Chloride: 101 mmol/L (ref 98–111)
Creatinine, Ser: 1.36 mg/dL — ABNORMAL HIGH (ref 0.61–1.24)
GFR, Estimated: 59 mL/min — ABNORMAL LOW (ref >60)
Glucose, Bld: 118 mg/dL — ABNORMAL HIGH (ref 70–99)
Potassium: 3.6 mmol/L (ref 3.5–5.1)
Sodium: 133 mmol/L — ABNORMAL LOW (ref 135–145)
Total Bilirubin: 1 mg/dL (ref 0.3–1.2)
Total Protein: 7 g/dL (ref 6.5–8.1)

## 2021-02-28 LAB — RESP PANEL BY RT-PCR (FLU A&B, COVID) ARPGX2
Influenza A by PCR: NEGATIVE
Influenza B by PCR: NEGATIVE
SARS Coronavirus 2 by RT PCR: POSITIVE — AB

## 2021-02-28 LAB — BRAIN NATRIURETIC PEPTIDE: B Natriuretic Peptide: 204 pg/mL — ABNORMAL HIGH (ref 0.0–100.0)

## 2021-02-28 LAB — TROPONIN I (HIGH SENSITIVITY)
Troponin I (High Sensitivity): 14 ng/L (ref <18)
Troponin I (High Sensitivity): 13 ng/L (ref <18)

## 2021-02-28 MED ORDER — FLUTICASONE PROPIONATE 50 MCG/ACT NA SUSP: 2.0000 | Freq: Every day | NASAL | 0 refills | Status: DC

## 2021-02-28 MED ORDER — ALBUTEROL SULFATE HFA 108 (90 BASE) MCG/ACT IN AERS: 1.0000 | INHALATION_SPRAY | Freq: Four times a day (QID) | RESPIRATORY_TRACT | 0 refills | Status: DC | PRN

## 2021-02-28 MED ORDER — BENZONATATE 100 MG PO CAPS: 100.0000 mg | ORAL_CAPSULE | Freq: Three times a day (TID) | ORAL | 0 refills | Status: DC | PRN

## 2021-02-28 MED ORDER — MOLNUPIRAVIR EUA 200MG CAPSULE: 4.0000 | ORAL_CAPSULE | Freq: Two times a day (BID) | ORAL | 0 refills | Status: AC

## 2021-02-28 NOTE — Discharge Instructions (Addendum)
You were seen in the emerge department today with COVID-19.  Your x-ray is not showing pneumonia and your COVID test is positive.  I am starting you on antiviral medications.  Please take as instructed.  Return to the emergency department any worsening shortness of breath, chest discomfort, confusion, or other sudden/severe symptoms.

## 2021-02-28 NOTE — ED Provider Notes (Signed)
Emergency Department Provider Note   I have reviewed the triage vital signs and the nursing notes.   HISTORY  Chief Complaint Covid Positive   HPI John Wilkerson is a 60 y.o. male with past history reviewed below including ACS with STEMI in June presents to the emergency department with cough, congestion, fatigue, body aches.  Symptoms of been developing over the past 2 to 3 days.  He has been compliant with cardiac rehab and from a cardiac standpoint feels like he is doing better.  He denies any chest pain/pressure.  He has developed some mild shortness of breath in the setting of other flulike symptoms described above.  No known sick contact.  He took a home COVID test today which came back positive.  He was feeling some mild shortness of breath and so presents for evaluation.  Denies swelling in the legs.  No abdominal pain or GI symptoms.  He remains compliant with his home medications.   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   Hypertensive heart disease with heart failure ([place]) 08/18/2020   STEMI (ST elevation myocardial infarction) ([place]) 08/16/2020   STEMI involving right coronary artery ([place]) 08/16/2020   ANXIETY 07/18/2008   TOBACCO USER 07/18/2008   Essential hypertension 07/18/2008   GERD 07/18/2008   HIATAL HERNIA 07/18/2008   ARTHRITIS 07/18/2008   LOW BACK PAIN, CHRONIC 07/18/2008    Past Surgical History:  Procedure Laterality Date   CORONARY STENT INTERVENTION N/A 08/20/2020   Procedure: CORONARY STENT INTERVENTION;  Surgeon: Burnell Blanks, MD;  Location: [facility];  Service: Cardiovascular;  Laterality: N/A;   CORONARY/GRAFT ACUTE MI REVASCULARIZATION N/A 08/16/2020   Procedure:  Coronary/Graft Acute MI Revascularization;  Surgeon: Nelva Bush, MD;  Location: [facility];  Service: Cardiovascular;  Laterality: N/A;   INTRAVASCULAR ULTRASOUND/IVUS N/A 08/20/2020   Procedure: Intravascular Ultrasound/IVUS;  Surgeon: Burnell Blanks, MD;  Location: [facility];  Service: Cardiovascular;  Laterality: N/A;   LEFT HEART CATH AND CORONARY ANGIOGRAPHY N/A 08/16/2020   Procedure: LEFT HEART CATH AND CORONARY ANGIOGRAPHY;  Surgeon: Nelva Bush, MD;  Location: [facility];  Service: Cardiovascular;  Laterality: N/A;    Allergies Codeine and Doxycycline  Family History  Problem Relation Age of Onset   AAA (abdominal aortic aneurysm) Father     Social History Social History   Tobacco Use   Smoking status: Former    Packs/day: 1.00    Types: Cigarettes   Smokeless tobacco: Never  Substance Use Topics   Alcohol use: Not Currently   Drug use: Not Currently    Review of Systems  Constitutional: Positive subjective fever/chills and body aches.  Eyes: No visual changes. ENT: No sore throat. Cardiovascular: Denies chest pain. Respiratory: Positive shortness of breath and cough.  Gastrointestinal: No abdominal pain.  No nausea, no vomiting.  No diarrhea.  No constipation. Genitourinary: Negative for dysuria. Musculoskeletal: Negative for back pain. Skin: Negative for rash. Neurological: Negative for headaches, focal weakness or numbness.  10-point ROS otherwise negative.  ____________________________________________   PHYSICAL EXAM:  VITAL SIGNS: ED Triage Vitals  Enc Vitals Group     BP 02/28/21 1145 111/62     Pulse Rate 02/28/21  1145 67     Resp 02/28/21 1145 (!) 22     Temp 02/28/21 1145 99.8 F (37.7 C)     Temp Source 02/28/21 1145 Oral     SpO2 02/28/21 1145 94 %   Constitutional: Alert and oriented. Well appearing and in no acute distress. Eyes: Conjunctivae are normal. Head: Atraumatic. Nose: Positive  congestion/rhinnorhea. Mouth/Throat: Mucous membranes are moist.   Neck: No stridor.   Cardiovascular: Normal rate, regular rhythm. Good peripheral circulation. Grossly normal heart sounds.   Respiratory: Normal respiratory effort.  No retractions. Lungs CTAB. Gastrointestinal: Soft and nontender. No distention.  Musculoskeletal: No lower extremity tenderness nor edema. No gross deformities of extremities. Neurologic:  Normal speech and language. No gross focal neurologic deficits are appreciated.  Skin:  Skin is warm, dry and intact. No rash noted.   ____________________________________________   LABS (all labs ordered are listed, but only abnormal results are displayed)  Labs Reviewed  RESP PANEL BY RT-PCR (FLU A&B, COVID) ARPGX2 - Abnormal; Notable for the following components:      Result Value   SARS Coronavirus 2 by RT PCR POSITIVE (*)    All other components within normal limits  COMPREHENSIVE METABOLIC PANEL - Abnormal; Notable for the following components:   Sodium 133 (*)    Glucose, Bld 118 (*)    Creatinine, Ser 1.36 (*)    Calcium 8.2 (*)    GFR, Estimated 59 (*)    All other components within normal limits  BRAIN NATRIURETIC PEPTIDE - Abnormal; Notable for the following components:   B Natriuretic Peptide 204.0 (*)    All other components within normal limits  CBC WITH DIFFERENTIAL/PLATELET  TROPONIN I (HIGH SENSITIVITY)  TROPONIN I (HIGH SENSITIVITY)   ____________________________________________  EKG   EKG Interpretation  Date/Time:  Saturday February 28 2021 12:22:04 EST Ventricular Rate:  67 PR Interval:  155 QRS Duration: 107 QT Interval:  411 QTC Calculation: 434 R Axis:   -81 Text Interpretation: Sinus rhythm Inferior infarct, old Consider anterior infarct Confirmed by Alona Bene [phone]) on 02/28/2021 1:07:16 PM        ____________________________________________  RADIOLOGY  DG Chest Portable 1 View  Result Date: 02/28/2021 CLINICAL  DATA:  Chest pain EXAM: PORTABLE CHEST 1 VIEW COMPARISON:  Chest x-ray 08/17/2020 FINDINGS: Heart is enlarged. Mediastinum appears stable and within normal limits. Mild calcified plaques in the aortic arch. Mild pulmonary vascular congestion. No focal consolidation identified. No pleural effusion or pneumothorax visualized. IMPRESSION: Cardiomegaly and pulmonary vascular congestion. Electronically Signed   By: Jannifer Hick M.D.   On: 02/28/2021 12:27    ____________________________________________   PROCEDURES  Procedure(s) performed:   Procedures  None  ____________________________________________   INITIAL IMPRESSION / ASSESSMENT AND PLAN / ED COURSE  Pertinent labs & imaging results that were available during my care of the patient were reviewed by me and considered in my medical decision making (see chart for details).   Patient presents emergency department with flulike symptoms and shortness of breath.  Clinically suspect COVID 19 as he did test positive on antigen test this morning.  He is not hypoxemic.  No increased work of breathing.  No fever.  Lungs here sounds equal.  Will perform chest x-ray to evaluate for more focal infiltrate to suggest community-acquired pneumonia.  Very low suspicion for ACS but do plan for chest x-ray along with labs.   02:50 PM  Patient's COVID PCR is positive.  Flu is negative.  Troponin within normal limits x2.  Patient's creatinine is 1.36 with GFR of 59.  BNP mildly elevated but no pneumonia on his chest x-ray.  Some cardiomegaly and pulmonary vascular congestion noted but patient does not clinically appear volume overloaded to prompt aggressive diuresis. No hypoxemia here or increased work of breathing.  Patient overall looks well.  We discussed antiviral medications.  Because of his ticagrelor, Paxlovid is listed as a contraindication.  Will prescribe Molnupiravir x 5 days with other supportive care medications. Discussed ED return with worsening  symptoms.    ____________________________________________  FINAL CLINICAL IMPRESSION(S) / ED DIAGNOSES  Final diagnoses:  COVID-19    NEW OUTPATIENT MEDICATIONS STARTED DURING THIS VISIT:  New Prescriptions   ALBUTEROL (VENTOLIN HFA) 108 (90 BASE) MCG/ACT INHALER    Inhale 1-2 puffs into the lungs every 6 (six) hours as needed for wheezing or shortness of breath.   BENZONATATE (TESSALON) 100 MG CAPSULE    Take 1 capsule (100 mg total) by mouth 3 (three) times daily as needed for cough.   FLUTICASONE (FLONASE) 50 MCG/ACT NASAL SPRAY    Place 2 sprays into both nostrils daily for 7 days.   MOLNUPIRAVIR EUA (LAGEVRIO) 200 MG CAPS CAPSULE    Take 4 capsules (800 mg total) by mouth 2 (two) times daily for 5 days.    Note:  This document was prepared using Dragon voice recognition software and may include unintentional dictation errors.  Nanda Quinton, MD, [facility]    Jaasia Viglione, W[facility], MD 02/28/21 1455

## 2021-02-28 NOTE — ED Triage Notes (Signed)
Positive covid test at home, c/o shortness of breath

## 2021-03-02 ENCOUNTER — Encounter (HOSPITAL_COMMUNITY): Payer: No Typology Code available for payment source

## 2021-03-04 ENCOUNTER — Encounter (HOSPITAL_COMMUNITY): Payer: No Typology Code available for payment source

## 2021-03-06 ENCOUNTER — Encounter (HOSPITAL_COMMUNITY): Payer: No Typology Code available for payment source

## 2021-03-09 ENCOUNTER — Encounter (HOSPITAL_COMMUNITY): Payer: No Typology Code available for payment source

## 2021-03-11 ENCOUNTER — Encounter (HOSPITAL_COMMUNITY)
Admission: RE | Admit: 2021-03-11 | Discharge: 2021-03-11 | Disposition: A | Discharge status: Home / Self Care | Payer: No Typology Code available for payment source | Source: Ambulatory Visit | Attending: Cardiology | Admitting: Cardiology

## 2021-03-11 ENCOUNTER — Encounter (HOSPITAL_COMMUNITY): Payer: No Typology Code available for payment source

## 2021-03-11 DIAGNOSIS — I2111 ST elevation (STEMI) myocardial infarction involving right coronary artery: Secondary | ICD-10-CM

## 2021-03-11 DIAGNOSIS — Z955 Presence of coronary angioplasty implant and graft: Secondary | ICD-10-CM

## 2021-03-11 NOTE — Progress Notes (Signed)
Daily Session Note  Patient Details  Name: John Wilkerson MRN: [id number] Date of Birth: [birth year] Referring Provider:   Flowsheet Row CARDIAC REHAB PHASE II ORIENTATION from 12/29/2020 in [facility]  Referring Provider Dr. Saunders Revel       Encounter Date: 03/11/2021  Check In:  Session Check In - 03/11/21 1100       Check-In   Supervising physician immediately available to respond to emergencies [facility] MD immediately available    Physician(s) Dr. Johnsie Cancel    Location [facility]    Staff Present Hoy Register, MS, ACSM-CEP, Exercise Physiologist;Heather Zigmund Daniel, Exercise Physiologist    Virtual Visit No    Medication changes reported     No    Fall or balance concerns reported    No    Tobacco Cessation No Change    Warm-up and Cool-down Performed as group-led instruction    Resistance Training Performed Yes    VAD Patient? No    PAD/SET Patient? No      Pain Assessment   Currently in Pain? No/denies    Pain Score 0-No pain    Multiple Pain Sites No             Capillary Blood Glucose: No results found for this or any previous visit (from the past 24 hour(s)).    Social History   Tobacco Use  Smoking Status Former   Packs/day: 1.00   Types: Cigarettes  Smokeless Tobacco Never    Goals Met:  Independence with exercise equipment Exercise tolerated well No report of concerns or symptoms today Strength training completed today  Goals Unmet:  Not Applicable  Comments: checkout time is 1200   Dr. Kathie Dike is Medical Director for [facility].

## 2021-03-13 ENCOUNTER — Encounter (HOSPITAL_COMMUNITY)
Admission: RE | Admit: 2021-03-13 | Discharge: 2021-03-13 | Disposition: A | Discharge status: Home / Self Care | Payer: No Typology Code available for payment source | Source: Ambulatory Visit | Attending: Cardiology | Admitting: Cardiology

## 2021-03-13 ENCOUNTER — Encounter (HOSPITAL_COMMUNITY): Payer: No Typology Code available for payment source

## 2021-03-13 DIAGNOSIS — I2111 ST elevation (STEMI) myocardial infarction involving right coronary artery: Secondary | ICD-10-CM

## 2021-03-13 DIAGNOSIS — Z955 Presence of coronary angioplasty implant and graft: Secondary | ICD-10-CM

## 2021-03-13 NOTE — Progress Notes (Signed)
Daily Session Note  Patient Details  Name: John Wilkerson MRN: [id number] Date of Birth: 01/17/1961 Referring Provider:   Flowsheet Row CARDIAC REHAB PHASE II ORIENTATION from 12/29/2020 in [facility]  Referring Provider Dr. Saunders Revel       Encounter Date: 03/13/2021  Check In:  Session Check In - 03/13/21 1100       Check-In   Supervising physician immediately available to respond to emergencies [facility] MD immediately available    Physician(s) Dr. Johnsie Cancel    Location [facility]    Staff Present Hoy Register, MS, ACSM-CEP, Exercise Physiologist;Heather Zigmund Daniel, Exercise Physiologist;Other    Virtual Visit No    Medication changes reported     No    Fall or balance concerns reported    No    Tobacco Cessation No Change    Warm-up and Cool-down Performed as group-led instruction    Resistance Training Performed Yes    VAD Patient? No      PAD/SET Patient   Completed foot check today? No    Open wounds to report? No      Pain Assessment   Currently in Pain? No/denies    Pain Score 0-No pain    Multiple Pain Sites No             Capillary Blood Glucose: No results found for this or any previous visit (from the past 24 hour(s)).    Social History   Tobacco Use  Smoking Status Former   Packs/day: 1.00   Types: Cigarettes  Smokeless Tobacco Never    Goals Met:  Independence with exercise equipment Exercise tolerated well No report of concerns or symptoms today Strength training completed today  Goals Unmet:  Not Applicable  Comments: checkout time is 1200   Dr. Kathie Dike is Medical Director for [facility].

## 2021-03-16 ENCOUNTER — Encounter (HOSPITAL_COMMUNITY): Payer: No Typology Code available for payment source

## 2021-03-18 ENCOUNTER — Encounter (HOSPITAL_COMMUNITY)
Admission: RE | Admit: 2021-03-18 | Discharge: 2021-03-18 | Disposition: A | Discharge status: Home / Self Care | Payer: No Typology Code available for payment source | Source: Ambulatory Visit | Attending: Cardiology | Admitting: Cardiology

## 2021-03-18 ENCOUNTER — Encounter (HOSPITAL_COMMUNITY): Payer: No Typology Code available for payment source

## 2021-03-18 DIAGNOSIS — I2111 ST elevation (STEMI) myocardial infarction involving right coronary artery: Secondary | ICD-10-CM | POA: Insufficient documentation

## 2021-03-18 DIAGNOSIS — Z955 Presence of coronary angioplasty implant and graft: Secondary | ICD-10-CM | POA: Diagnosis present

## 2021-03-18 NOTE — Progress Notes (Signed)
Cardiac Individual Treatment Plan  Patient Details  Name: John Wilkerson MRN: [id number] Date of Birth: 05-May-1960 Referring Provider:   Flowsheet Row [facility]ARDI[facility] REHAB [facility]  Referring Provider Dr. Okey DupreEnd       Initial Encounter Date:  Flowsheet Row CARDIAC REHAB PHASE II [facility] [facility]  Date 12/29/20       Visit Diagnosis: ST elevation myocardial infarction involving right coronary artery [facility](HCC)  Status post coronary artery stent placement  Patient's Home Medications on Admission:  Current Outpatient Medications:    albuterol (VENTOLIN HFA) 108 (90 Base) MCG/ACT inhaler, Inhale 1-2 puffs into the lungs every 6 (six) hours as needed for wheezing or shortness of breath., Disp: 6.7 g, Rfl: 0   amLODipine (NORVASC) 10 MG tablet, Take 1 tablet (10 mg total) by mouth daily., Disp: 30 tablet, Rfl: 11   aspirin 81 MG chewable tablet, Chew 1 tablet (81 mg total) by mouth daily., Disp: 90 tablet, Rfl: 3   atorvastatin (LIPITOR) 80 MG tablet, Take 1 tablet (80 mg total) by mouth daily., Disp: 30 tablet, Rfl: 11   benzonatate (TESSALON) 100 MG capsule, Take 1 capsule (100 mg total) by mouth 3 (three) times daily as needed for cough., Disp: 21 capsule, Rfl: 0   carvedilol (COREG) 12.5 MG tablet, Take 1 tablet (12.5 mg total) by mouth 2 (two) times daily with a meal., Disp: 60 tablet, Rfl: 11   empagliflozin (JARDIANCE) 10 MG TABS tablet, Take 1 tablet (10 mg total) by mouth daily., Disp: 30 tablet, Rfl: 11   escitalopram (LEXAPRO) 5 MG tablet, Take 5 mg by mouth daily., Disp: , Rfl:    fluticasone (FLONASE) 50 MCG/ACT nasal spray, Place 2 sprays into both nostrils daily for 7 days., Disp: 1 g, Rfl: 0   Multiple Vitamin (MULTIVITAMIN ADULT PO), Take 1 tablet by mouth daily., Disp: , Rfl:    nitroGLYCERIN (NITROSTAT) 0.4 MG SL tablet, Place 1 tablet (0.4 mg total) under the tongue every  5 (five) minutes x 3 doses as needed for chest pain., Disp: 100 tablet, Rfl: 1   Omega-3 Fatty Acids (FISH OIL OMEGA-3 PO), Take 2 capsules by mouth daily., Disp: , Rfl:    ticagrelor (BRILINTA) 90 MG TABS tablet, Take 1 tablet (90 mg total) by mouth 2 (two) times daily., Disp: 60 tablet, Rfl: 11   Turmeric (QC TUMERIC COMPLEX PO), Take 1 tablet by mouth daily., Disp: , Rfl:   Past Medical History: Past Medical History:  Diagnosis Date   Hiatal hernia     Tobacco Use: Social History   Tobacco Use  Smoking Status Former   Packs/day: 1.00   Types: Cigarettes  Smokeless Tobacco Never    Labs: Recent Review Flowsheet Data     Labs for ITP Cardiac and Pulmonary Rehab Latest Ref Rng & Units 08/16/2020 08/16/2020 08/18/2020 08/18/2020   Cholestrol 0 - 200 mg/dL 956(O223(H) - - -   LDLCALC 0 - 99 mg/dL 130(Q153(H) - - -   HDL >65>40 mg/dL 78(I34(L) - - -   Trlycerides <150 mg/dL 696(E180(H) - - -   Hemoglobin A1c 4.8 - 5.6 % 5.6 5.6 - -   PHART 7.350 - 7.450 - - - 7.481(H)   PCO2ART 32.0 - 48.0 mmHg - - - 30.3(L)   HCO3 20.0 - 28.0 mmol/L - - 25.7 22.7   TCO2 22 - 32 mmol/L - - - 24   O2SAT % - -  41.1 93.0       Capillary Blood Glucose: Lab Results  Component Value Date   GLUCAP 117 (H) 01/05/2021   GLUCAP 138 (H) 01/02/2021   GLUCAP 97 12/31/2020   GLUCAP 88 12/31/2020   GLUCAP 102 (H) 12/29/2020    POCT Glucose     Row Name 12/29/20 1332 01/05/21 1313           POCT Blood Glucose   Pre-Exercise 102 mg/dL --      Pre-Exercise #2 -- 117 mg/dL               Exercise Target Goals: Exercise Program Goal: Individual exercise prescription set using results from initial 6 min walk test and THRR while considering  patients activity barriers and safety.   Exercise Prescription Goal: Starting with aerobic activity 30 plus minutes a day, 3 days per week for initial exercise prescription. Provide home exercise prescription and guidelines that participant acknowledges understanding prior to  discharge.  Activity Barriers & Risk Stratification:  Activity Barriers & Cardiac Risk Stratification - 12/29/20 1300       Activity Barriers & Cardiac Risk Stratification   Activity Barriers Arthritis;Back Problems    Cardiac Risk Stratification High             6 Minute Walk:  6 Minute Walk     Row Name 12/29/20 1404         6 Minute Walk   Phase Initial     Distance 1250 feet     Walk Time 6 minutes     # of Rest Breaks 0     MPH 2.4     METS 3.04     RPE 14     VO2 Peak 10.65     Symptoms No     Resting HR 63 bpm     Resting BP 126/64     Resting Oxygen Saturation  96 %     Exercise Oxygen Saturation  during 6 min walk 96 %     Max Ex. HR 75 bpm     Max Ex. BP 145/75     2 Minute Post BP 130/70              Oxygen Initial Assessment:   Oxygen Re-Evaluation:   Oxygen Discharge (Final Oxygen Re-Evaluation):   Initial Exercise Prescription:  Initial Exercise Prescription - 12/29/20 1400       Date of Initial Exercise RX and Referring Provider   Date 12/29/20    Referring Provider Dr. Saunders Revel    Expected Discharge Date 03/23/21      Treadmill   MPH 1.7    Grade 0    Minutes 17      NuStep   Level 1    SPM 70    Minutes 22      Prescription Details   Frequency (times per week) 3    Duration Progress to 30 minutes of continuous aerobic without signs/symptoms of physical distress      Intensity   THRR 40-80% of Max Heartrate 64-127    Ratings of Perceived Exertion 11-15    Perceived Dyspnea 0-4      Resistance Training   Training Prescription Yes    Weight 3    Reps 10-15             Perform Capillary Blood Glucose checks as needed.  Exercise Prescription Changes:   Exercise Prescription Changes     Row Name 01/05/21 1300 01/16/21 1100 01/19/21  1300 02/02/21 1200 02/16/21 1153     Response to Exercise   Blood Pressure (Admit) 128/62 -- 130/68 124/70 130/76   Blood Pressure (Exercise) 128/68 -- 140/60 126/64 132/70    Blood Pressure (Exit) 128/70 -- 120/65 112/64 110/60   Heart Rate (Admit) 60 bpm -- 70 bpm 71 bpm 72 bpm   Heart Rate (Exercise) 79 bpm -- 89 bpm 96 bpm 82 bpm   Heart Rate (Exit) 69 bpm -- 79 bpm 79 bpm 79 bpm   Rating of Perceived Exertion (Exercise) 16 -- 14 14 14    Duration Continue with 30 min of aerobic exercise without signs/symptoms of physical distress. -- Continue with 30 min of aerobic exercise without signs/symptoms of physical distress. Continue with 30 min of aerobic exercise without signs/symptoms of physical distress. Continue with 30 min of aerobic exercise without signs/symptoms of physical distress.   Intensity THRR unchanged -- THRR unchanged THRR unchanged THRR unchanged     Progression   Progression -- -- Continue to progress workloads to maintain intensity without signs/symptoms of physical distress. Continue to progress workloads to maintain intensity without signs/symptoms of physical distress. Continue to progress workloads to maintain intensity without signs/symptoms of physical distress.     Resistance Training   Training Prescription Yes -- Yes Yes Yes   Weight 4 lbs -- 4 lbs 4 lbs 4 lbs   Reps 10-15 -- 10-15 10-15 10-15   Time 10 Minutes -- 10 Minutes 10 Minutes 10 Minutes     Treadmill   MPH 1.7 -- 1.7 1.7 1.7   Grade 0 -- 0 0 0   Minutes 22 -- 22 22 22    METs 2.3 -- 2.3 2.3 2.3     NuStep   Level 1 -- 1 1 1    SPM 86 -- 95 104 100   Minutes 17 -- 17 17 17    METs 2.11 -- 2.23 2.38 2.25     Home Exercise Plan   Plans to continue exercise at -- Home (comment) -- -- --   Frequency -- Add 2 additional days to program exercise sessions. -- -- --   Initial Home Exercises Provided -- 01/16/21 -- -- --    [place] Name 02/25/21 1100 03/13/21 1100           Response to Exercise   Blood Pressure (Admit) 100/52 124/50      Blood Pressure (Exercise) 135/60 116/52      Blood Pressure (Exit) 120/60 136/80      Heart Rate (Admit) 71 bpm 74 bpm      Heart Rate  (Exercise) 97 bpm 88 bpm      Heart Rate (Exit) 80 bpm 81 bpm      Rating of Perceived Exertion (Exercise) 14 13      Duration Continue with 30 min of aerobic exercise without signs/symptoms of physical distress. Continue with 30 min of aerobic exercise without signs/symptoms of physical distress.      Intensity THRR unchanged THRR unchanged        Progression   Progression Continue to progress workloads to maintain intensity without signs/symptoms of physical distress. Continue to progress workloads to maintain intensity without signs/symptoms of physical distress.        Resistance Training   Training Prescription Yes Yes      Weight 4 4 lbs      Reps 10-15 10-15      Time 10 Minutes 10 Minutes        Treadmill   MPH  1.7 1.7      Grade 0 0      Minutes 22 22      METs 2.3 2.3        NuStep   Level 1 1      SPM 93 96      Minutes 17 17      METs 2.07 2.2               Exercise Comments:   Exercise Comments     Row Name 01/16/21 1141           Exercise Comments home exercise reviewed                Exercise Goals and Review:   Exercise Goals     Row Name 12/29/20 1408 01/19/21 1317 02/16/21 1154 03/17/21 1329       Exercise Goals   Increase Physical Activity Yes Yes Yes Yes    Intervention Provide advice, education, support and counseling about physical activity/exercise needs.;Develop an individualized exercise prescription for aerobic and resistive training based on initial evaluation findings, risk stratification, comorbidities and participant's personal goals. -- Provide advice, education, support and counseling about physical activity/exercise needs.;Develop an individualized exercise prescription for aerobic and resistive training based on initial evaluation findings, risk stratification, comorbidities and participant's personal goals. Provide advice, education, support and counseling about physical activity/exercise needs.;Develop an individualized  exercise prescription for aerobic and resistive training based on initial evaluation findings, risk stratification, comorbidities and participant's personal goals.    Expected Outcomes Short Term: Attend rehab on a regular basis to increase amount of physical activity.;Long Term: Add in home exercise to make exercise part of routine and to increase amount of physical activity.;Long Term: Exercising regularly at least 3-5 days a week. Short Term: Attend rehab on a regular basis to increase amount of physical activity.;Long Term: Add in home exercise to make exercise part of routine and to increase amount of physical activity.;Long Term: Exercising regularly at least 3-5 days a week. Short Term: Attend rehab on a regular basis to increase amount of physical activity.;Long Term: Add in home exercise to make exercise part of routine and to increase amount of physical activity.;Long Term: Exercising regularly at least 3-5 days a week. Short Term: Attend rehab on a regular basis to increase amount of physical activity.;Long Term: Add in home exercise to make exercise part of routine and to increase amount of physical activity.;Long Term: Exercising regularly at least 3-5 days a week.    Increase Strength and Stamina Yes Yes -- Yes    Intervention Provide advice, education, support and counseling about physical activity/exercise needs.;Develop an individualized exercise prescription for aerobic and resistive training based on initial evaluation findings, risk stratification, comorbidities and participant's personal goals. Provide advice, education, support and counseling about physical activity/exercise needs.;Develop an individualized exercise prescription for aerobic and resistive training based on initial evaluation findings, risk stratification, comorbidities and participant's personal goals. Provide advice, education, support and counseling about physical activity/exercise needs.;Develop an individualized exercise  prescription for aerobic and resistive training based on initial evaluation findings, risk stratification, comorbidities and participant's personal goals. Provide advice, education, support and counseling about physical activity/exercise needs.;Develop an individualized exercise prescription for aerobic and resistive training based on initial evaluation findings, risk stratification, comorbidities and participant's personal goals.    Expected Outcomes Short Term: Increase workloads from initial exercise prescription for resistance, speed, and METs.;Short Term: Perform resistance training exercises routinely during rehab and add in resistance training at  home;Long Term: Improve cardiorespiratory fitness, muscular endurance and strength as measured by increased METs and functional capacity (6MWT) Short Term: Increase workloads from initial exercise prescription for resistance, speed, and METs.;Short Term: Perform resistance training exercises routinely during rehab and add in resistance training at home;Long Term: Improve cardiorespiratory fitness, muscular endurance and strength as measured by increased METs and functional capacity (6MWT) Short Term: Increase workloads from initial exercise prescription for resistance, speed, and METs.;Short Term: Perform resistance training exercises routinely during rehab and add in resistance training at home;Long Term: Improve cardiorespiratory fitness, muscular endurance and strength as measured by increased METs and functional capacity (6MWT) Short Term: Increase workloads from initial exercise prescription for resistance, speed, and METs.;Short Term: Perform resistance training exercises routinely during rehab and add in resistance training at home;Long Term: Improve cardiorespiratory fitness, muscular endurance and strength as measured by increased METs and functional capacity (6MWT)    Able to understand and use rate of perceived exertion (RPE) scale Yes Yes Yes Yes     Intervention Provide education and explanation on how to use RPE scale Provide education and explanation on how to use RPE scale Provide education and explanation on how to use RPE scale Provide education and explanation on how to use RPE scale    Expected Outcomes Short Term: Able to use RPE daily in rehab to express subjective intensity level;Long Term:  Able to use RPE to guide intensity level when exercising independently Short Term: Able to use RPE daily in rehab to express subjective intensity level;Long Term:  Able to use RPE to guide intensity level when exercising independently Short Term: Able to use RPE daily in rehab to express subjective intensity level;Long Term:  Able to use RPE to guide intensity level when exercising independently Short Term: Able to use RPE daily in rehab to express subjective intensity level;Long Term:  Able to use RPE to guide intensity level when exercising independently    Knowledge and understanding of Target Heart Rate Range (THRR) Yes Yes Yes Yes    Intervention Provide education and explanation of THRR including how the numbers were predicted and where they are located for reference Provide education and explanation of THRR including how the numbers were predicted and where they are located for reference Provide education and explanation of THRR including how the numbers were predicted and where they are located for reference Provide education and explanation of THRR including how the numbers were predicted and where they are located for reference    Expected Outcomes Short Term: Able to state/look up THRR;Short Term: Able to use daily as guideline for intensity in rehab;Long Term: Able to use THRR to govern intensity when exercising independently Short Term: Able to state/look up THRR;Short Term: Able to use daily as guideline for intensity in rehab;Long Term: Able to use THRR to govern intensity when exercising independently Short Term: Able to state/look up  THRR;Short Term: Able to use daily as guideline for intensity in rehab;Long Term: Able to use THRR to govern intensity when exercising independently Short Term: Able to state/look up THRR;Short Term: Able to use daily as guideline for intensity in rehab;Long Term: Able to use THRR to govern intensity when exercising independently    Able to check pulse independently Yes Yes Yes Yes    Intervention Provide education and demonstration on how to check pulse in carotid and radial arteries.;Review the importance of being able to check your own pulse for safety during independent exercise Provide education and demonstration on how to check  pulse in carotid and radial arteries.;Review the importance of being able to check your own pulse for safety during independent exercise Provide education and demonstration on how to check pulse in carotid and radial arteries.;Review the importance of being able to check your own pulse for safety during independent exercise Provide education and demonstration on how to check pulse in carotid and radial arteries.;Review the importance of being able to check your own pulse for safety during independent exercise    Expected Outcomes Short Term: Able to explain why pulse checking is important during independent exercise;Long Term: Able to check pulse independently and accurately Short Term: Able to explain why pulse checking is important during independent exercise;Long Term: Able to check pulse independently and accurately Short Term: Able to explain why pulse checking is important during independent exercise;Long Term: Able to check pulse independently and accurately Short Term: Able to explain why pulse checking is important during independent exercise;Long Term: Able to check pulse independently and accurately    Understanding of Exercise Prescription Yes Yes Yes Yes    Intervention Provide education, explanation, and written materials on patient's individual exercise prescription  Provide education, explanation, and written materials on patient's individual exercise prescription Provide education, explanation, and written materials on patient's individual exercise prescription Provide education, explanation, and written materials on patient's individual exercise prescription    Expected Outcomes Short Term: Able to explain program exercise prescription;Long Term: Able to explain home exercise prescription to exercise independently Short Term: Able to explain program exercise prescription;Long Term: Able to explain home exercise prescription to exercise independently Short Term: Able to explain program exercise prescription;Long Term: Able to explain home exercise prescription to exercise independently Short Term: Able to explain program exercise prescription;Long Term: Able to explain home exercise prescription to exercise independently             Exercise Goals Re-Evaluation :  Exercise Goals Re-Evaluation     Row Name 01/19/21 1317 02/17/21 0745 03/17/21 1329         Exercise Goal Re-Evaluation   Exercise Goals Review Increase Physical Activity;Increase Strength and Stamina;Able to understand and use rate of perceived exertion (RPE) scale;Knowledge and understanding of Target Heart Rate Range (THRR);Able to check pulse independently;Understanding of Exercise Prescription Increase Physical Activity;Increase Strength and Stamina;Able to understand and use rate of perceived exertion (RPE) scale;Knowledge and understanding of Target Heart Rate Range (THRR);Able to check pulse independently;Understanding of Exercise Prescription Increase Physical Activity;Increase Strength and Stamina;Able to understand and use rate of perceived exertion (RPE) scale;Knowledge and understanding of Target Heart Rate Range (THRR);Able to check pulse independently;Understanding of Exercise Prescription     Comments Pt has completed 10 sessions of cardiac rehab. He is limited by hip pain and  sometimes he is unable to walk his normal speed on the treadmill due to the pain. He is beginning to get his stamina back and is beginning to progress. He is currently exercising at 2.3 METs. Will continue to monitor and progress as able. Pt has completed 21 sessions of cardiac rehab. His continues to be limited by hip pain while walkingnon the treadmill and unable to walk normal speed or increase speed. Pt could progress workload on the stepper. He is currently exercising at 2.30 METs on the treadmill. Will continue to monitor and progress as able. Pt has completed 27 sessions of cardiac rehab. His hip pain continues to limit his progress as he cannot increase his workloads without pain. He is still recovering from a bout of COVID, and he  missed several sessions due to this. He is scheduled to graduate from the program this week but I will ask to see if he would like to make up some of his sessions. He is currently exercising at 2.3 METs on the treadmill. Will continue to monitor and progress as able.     Expected Outcomes Through exercise at rehab and at home, the patient will meet their stated goals. Through exercise at rehab and at home, the patient will meet their stated goals. Through exercise at rehab and at home, the patient will meet their stated goals.               Discharge Exercise Prescription (Final Exercise Prescription Changes):  Exercise Prescription Changes - 03/13/21 1100       Response to Exercise   Blood Pressure (Admit) 124/50    Blood Pressure (Exercise) 116/52    Blood Pressure (Exit) 136/80    Heart Rate (Admit) 74 bpm    Heart Rate (Exercise) 88 bpm    Heart Rate (Exit) 81 bpm    Rating of Perceived Exertion (Exercise) 13    Duration Continue with 30 min of aerobic exercise without signs/symptoms of physical distress.    Intensity THRR unchanged      Progression   Progression Continue to progress workloads to maintain intensity without signs/symptoms of physical  distress.      Resistance Training   Training Prescription Yes    Weight 4 lbs    Reps 10-15    Time 10 Minutes      Treadmill   MPH 1.7    Grade 0    Minutes 22    METs 2.3      NuStep   Level 1    SPM 96    Minutes 17    METs 2.2             Nutrition:  Target Goals: Understanding of nutrition guidelines, daily intake of sodium 1500mg , cholesterol 200mg , calories 30% from fat and 7% or less from saturated fats, daily to have 5 or more servings of fruits and vegetables.  Biometrics:  Pre Biometrics - 12/29/20 1408       Pre Biometrics   Height 5\' 11"  (1.803 m)    Weight 97.4 kg    Waist Circumference 43 inches    Hip Circumference 43 inches    Waist to Hip Ratio 1 %    BMI (Calculated) 29.96    Triceps Skinfold 11 mm    % Body Fat 28 %    Grip Strength 38.9 kg    Flexibility 4 in    Single Leg Stand 4 seconds              Nutrition Therapy Plan and Nutrition Goals:  Nutrition Therapy & Goals - 02/11/21 1250       Personal Nutrition Goals   Comments Patient scored 25 on his diet assessment. We offer 2 educational sessions on heart healthy nutrition with handouts.      Intervention Plan   Intervention Nutrition handout(s) given to patient.    Expected Outcomes Short Term Goal: Understand basic principles of dietary content, such as calories, fat, sodium, cholesterol and nutrients.             Nutrition Assessments:  Nutrition Assessments - 12/29/20 1304       MEDFICTS Scores   Pre Score 25            MEDIFICTS Score Key: ?70 Need to make  dietary changes  40-70 Heart Healthy Diet ? 40 Therapeutic Level Cholesterol Diet   Picture Your Plate Scores: [id number] Unhealthy dietary pattern with much room for improvement. 41-50 Dietary pattern unlikely to meet recommendations for good health and room for improvement. 51-60 More healthful dietary pattern, with some room for improvement.  >60 Healthy dietary pattern, although there may be  some specific behaviors that could be improved.    Nutrition Goals Re-Evaluation:   Nutrition Goals Discharge (Final Nutrition Goals Re-Evaluation):   Psychosocial: Target Goals: Acknowledge presence or absence of significant depression and/or stress, maximize coping skills, provide positive support system. Participant is able to verbalize types and ability to use techniques and skills needed for reducing stress and depression.  Initial Review & Psychosocial Screening:  Initial Psych Review & Screening - 12/29/20 1301       Initial Review   Current issues with Current Anxiety/Panic   escitalopram recently increased to 10 mg daily     Family Dynamics   Good Support System? Yes    Comments His support system includes his three children and his wife.      Barriers   Psychosocial barriers to participate in program There are no identifiable barriers or psychosocial needs.;The patient should benefit from training in stress management and relaxation.      Screening Interventions   Interventions Encouraged to exercise    Expected Outcomes Long Term Goal: Stressors or current issues are controlled or eliminated.;Short Term goal: Identification and review with participant of any Quality of Life or Depression concerns found by scoring the questionnaire.;Long Term goal: The participant improves quality of Life and PHQ9 Scores as seen by post scores and/or verbalization of changes             Quality of Life Scores:  Quality of Life - 12/29/20 1409       Quality of Life   Select Quality of Life      Quality of Life Scores   Health/Function Pre 22 %    Socioeconomic Pre 18 %    Psych/Spiritual Pre 16.36 %    Family Pre 27.6 %    GLOBAL Pre 20.84 %            Scores of 19 and below usually indicate a poorer quality of life in these areas.  A difference of  2-3 points is a clinically meaningful difference.  A difference of 2-3 points in the total score of the Quality of Life  Index has been associated with significant improvement in overall quality of life, self-image, physical symptoms, and general health in studies assessing change in quality of life.  PHQ-9: Recent Review Flowsheet Data     Depression screen [facility] 2/9 12/29/2020   Decreased Interest 0   Down, Depressed, Hopeless 0   PHQ - 2 Score 0   Altered sleeping 1   Tired, decreased energy 2   Change in appetite 0   Feeling bad or failure about yourself  1   Trouble concentrating 0   Moving slowly or fidgety/restless 1   Suicidal thoughts 0   PHQ-9 Score 5   Difficult doing work/chores Not difficult at all      Interpretation of Total Score  Total Score Depression Severity:  1-4 = Minimal depression, 5-9 = Mild depression, 10-14 = Moderate depression, 15-19 = Moderately severe depression, 20-27 = Severe depression   Psychosocial Evaluation and Intervention:  Psychosocial Evaluation - 12/29/20 1344       Psychosocial Evaluation & Interventions  Interventions Encouraged to exercise with the program and follow exercise prescription    Comments Pt has no barriers to completing rehab. Pt has no identifiable psychosocial issues. He does take lexapro which helps him with anxiety and sleep. He biggest issue right now is his lack of energy since his STEMI and stents. He reports that he has a good support system with his wife and his children and copes well with his problems. He has lost over 20 lbs since his was in the hospital by cleaning up his diet. He hopes to lose about 10 more lbs while in the program in order to weight less than 200 lbs. His other goals include gaining his strength and stamina back. He is eager to start the program.    Expected Outcomes Pt will continue to take lexapro for anxiety and will not have any other identifiable psychosocial issues.    Continue Psychosocial Services  No Follow up required             Psychosocial Re-Evaluation:  Psychosocial Re-Evaluation     Row  Name 01/14/21 1404 02/09/21 1335 03/05/21 1229         Psychosocial Re-Evaluation   Current issues with Current Anxiety/Panic Current Anxiety/Panic Current Anxiety/Panic     Comments Patient is new to the program completing 7 sessions. He continues to have no psychosocial barriers identified. His anxiety is managed with Lexapro. He seems to enjoy coming to the program and demonstrates an interest in improving his health. We will continue to monitor for progress. Patient is new to the program completing 18 sessions. He continues to have no psychosocial barriers identified. His anxiety continues to be managed with Lexapro. He continues to enjoy coming to the program and demonstrates an interest in improving his health. We will continue to monitor for progress. Patient is new to the program completing 25 sessions. He continues to have no psychosocial barriers identified. His anxiety continues to be managed with Lexapro. He continues to enjoy coming to the program and demonstrates an interest in improving his health. We will continue to monitor for progress.     Expected Outcomes Patient will continue to have no psychosocial barriers identified. Patient will continue to have no psychosocial barriers identified. Patient will continue to have no psychosocial barriers identified.     Interventions Stress management education;Encouraged to attend Cardiac Rehabilitation for the exercise;Relaxation education Stress management education;Encouraged to attend Cardiac Rehabilitation for the exercise;Relaxation education Stress management education;Encouraged to attend Cardiac Rehabilitation for the exercise;Relaxation education     Continue Psychosocial Services  No Follow up required No Follow up required No Follow up required              Psychosocial Discharge (Final Psychosocial Re-Evaluation):  Psychosocial Re-Evaluation - 03/05/21 1229       Psychosocial Re-Evaluation   Current issues with Current  Anxiety/Panic    Comments Patient is new to the program completing 25 sessions. He continues to have no psychosocial barriers identified. His anxiety continues to be managed with Lexapro. He continues to enjoy coming to the program and demonstrates an interest in improving his health. We will continue to monitor for progress.    Expected Outcomes Patient will continue to have no psychosocial barriers identified.    Interventions Stress management education;Encouraged to attend Cardiac Rehabilitation for the exercise;Relaxation education    Continue Psychosocial Services  No Follow up required             Vocational Rehabilitation: Provide vocational rehab  assistance to qualifying candidates.   Vocational Rehab Evaluation & Intervention:  Vocational Rehab - 12/29/20 1318       Initial Vocational Rehab Evaluation & Intervention   Assessment shows need for Vocational Rehabilitation No             Education: Education Goals: Education classes will be provided on a weekly basis, covering required topics. Participant will state understanding/return demonstration of topics presented.  Learning Barriers/Preferences:  Learning Barriers/Preferences - 12/29/20 1315       Learning Barriers/Preferences   Learning Barriers None    Learning Preferences Audio;Computer/Internet;Group Instruction;Skilled Demonstration;Pictoral;Individual Instruction;Verbal Instruction;Video;Written Material             Education Topics: Hypertension, Hypertension Reduction -Define heart disease and high blood pressure. Discus how high blood pressure affects the body and ways to reduce high blood pressure.   Exercise and Your Heart -Discuss why it is important to exercise, the FITT principles of exercise, normal and abnormal responses to exercise, and how to exercise safely.   Angina -Discuss definition of angina, causes of angina, treatment of angina, and how to decrease risk of having  angina. Flowsheet Row CARDIAC REHAB PHASE II EXERCISE from 03/11/2021 in [place]  Date 12/31/20  Educator [place]  Instruction Review Code 2- Demonstrated Understanding       Cardiac Medications -Review what the following cardiac medications are used for, how they affect the body, and side effects that may occur when taking the medications.  Medications include Aspirin, Beta blockers, calcium channel blockers, ACE Inhibitors, angiotensin receptor blockers, diuretics, digoxin, and antihyperlipidemics. Flowsheet Row CARDIAC REHAB PHASE II EXERCISE from 03/11/2021 in [place]  Date 01/07/21  Educator DF  Instruction Review Code 2- Demonstrated Understanding       Congestive Heart Failure -Discuss the definition of CHF, how to live with CHF, the signs and symptoms of CHF, and how keep track of weight and sodium intake. Flowsheet Row CARDIAC REHAB PHASE II EXERCISE from 03/11/2021 in [place]  Date 01/14/21  Educator DJ  Instruction Review Code 1- Verbalizes Understanding       Heart Disease and Intimacy -Discus the effect sexual activity has on the heart, how changes occur during intimacy as we age, and safety during sexual activity. Flowsheet Row CARDIAC REHAB PHASE II EXERCISE from 03/11/2021 in [place]  Date 01/21/21  Educator DF  Instruction Review Code 2- Demonstrated Understanding       Smoking Cessation / COPD -Discuss different methods to quit smoking, the health benefits of quitting smoking, and the definition of COPD. Flowsheet Row CARDIAC REHAB PHASE II EXERCISE from 03/11/2021 in [place]  Date 01/28/21  Educator DJ  Instruction Review Code 1- Verbalizes Understanding       Nutrition I: Fats -Discuss the types of cholesterol, what cholesterol does to the heart, and how cholesterol levels can be controlled. Flowsheet Row CARDIAC REHAB  PHASE II EXERCISE from 03/11/2021 in [place]  Date 02/04/21  Educator DF  Instruction Review Code 2- Demonstrated Understanding       Nutrition II: Labels -Discuss the different components of food labels and how to read food label [place] from 03/11/2021 in [place]  Date 02/18/21  Educator [place]  Instruction Review Code 1- Verbalizes Understanding       Heart Parts/Heart Disease and PAD -Discuss the anatomy of the heart, the pathway of blood circulation  through the heart, and these are affected by heart disease. Flowsheet Row CARDIAC REHAB PHASE II EXERCISE from 03/11/2021 in [place]  Date 02/25/21  Educator pb  Instruction Review Code 1- Verbalizes Understanding       Stress I: Signs and Symptoms -Discuss the causes of stress, how stress may lead to anxiety and depression, and ways to limit stress.   Stress II: Relaxation -Discuss different types of relaxation techniques to limit stress. Flowsheet Row CARDIAC REHAB PHASE II EXERCISE from 03/11/2021 in [place]  Date 03/11/21  Educator DF  Instruction Review Code 2- Demonstrated Understanding       Warning Signs of Stroke / TIA -Discuss definition of a stroke, what the signs and symptoms are of a stroke, and how to identify when someone is having stroke.   Knowledge Questionnaire Score:  Knowledge Questionnaire Score - 12/29/20 1315       Knowledge Questionnaire Score   Pre Score 23/28             Core Components/Risk Factors/Patient Goals at Admission:  Personal Goals and Risk Factors at Admission - 12/29/20 1318       Core Components/Risk Factors/Patient Goals on Admission    Weight Management Yes;Obesity;Weight Loss    Intervention Weight Management: Develop a combined nutrition and exercise program designed to reach desired caloric intake, while maintaining  appropriate intake of nutrient and fiber, sodium and fats, and appropriate energy expenditure required for the weight goal.;Weight Management: Provide education and appropriate resources to help participant work on and attain dietary goals.;Weight Management/Obesity: Establish reasonable short term and long term weight goals.;Obesity: Provide education and appropriate resources to help participant work on and attain dietary goals.    Admit Weight 210 lb (95.3 kg)    Goal Weight: Short Term 200 lb (90.7 kg)    Expected Outcomes Short Term: Continue to assess and modify interventions until short term weight is achieved;Long Term: Adherence to nutrition and physical activity/exercise program aimed toward attainment of established weight goal;Weight Maintenance: Understanding of the daily nutrition guidelines, which includes 25-35% calories from fat, 7% or less cal from saturated fats, less than 200mg  cholesterol, less than 1.5gm of sodium, & 5 or more servings of fruits and vegetables daily;Weight Loss: Understanding of general recommendations for a balanced deficit meal plan, which promotes 1-2 lb weight loss per week and includes a negative energy balance of [phone] kcal/d;Understanding recommendations for meals to include 15-35% energy as protein, 25-35% energy from fat, 35-60% energy from carbohydrates, less than 200mg  of dietary cholesterol, 20-35 gm of total fiber daily;Understanding of distribution of calorie intake throughout the day with the consumption of 4-5 meals/snacks    Improve shortness of breath with ADL's Yes    Intervention Provide education, individualized exercise plan and daily activity instruction to help decrease symptoms of SOB with activities of daily living.    Expected Outcomes Short Term: Improve cardiorespiratory fitness to achieve a reduction of symptoms when performing ADLs;Long Term: Be able to perform more ADLs without symptoms or delay the onset of symptoms              Core Components/Risk Factors/Patient Goals Review:   Goals and Risk Factor Review     Row Name 01/14/21 1406 02/09/21 1336 03/05/21 1229         Core Components/Risk Factors/Patient Goals Review   Personal Goals Review Weight Management/Obesity Weight Management/Obesity Weight Management/Obesity     Review Patient was referred to CR with STEMI  and S/P Stent. He has multiple risk factors for CAD and is participating in the program for risk modification. He has not been diagnosed with DM but is taking on Jardiance for HF. His last A1C was 08/16/20 at 5.6%. His blood pressure is well controlled. His personal goals for the program are to lose 10 lbs; increase his strenght and stamina and increase his energy level. We will continue to monitor his progress as he works towards meeting these goals. Patient has completed 18 sessions gaining 3 lbs since last 30 day review. He is doing well in the program with consistent attendance and progression. His blood pressure is at goal of below 130/80. He had ECHO 10/27 with EF of 55%. Cardiology says he wants b/p below 130/80. Patient's activity in CR is sometimes limited by his hip pain. His personal goals continue to be to lose 10 lbs and increase his strength and stamina. We will continue to monitor his progress as he works towards meeting these goals. Patient has completed 25 session. His initial weight wast 215.0 lbs and his current weight is 222.6 lbs. He continues to do well in the program with consistent attendance and progression. He was seen in the ED 02/28/21 and treated for COVID with milnupiravir and discharged home. He plans to return after 10 days and symptom free. His blood pressure is at goal.  Patient's activity in CR is continues to be at times limited by his hip pain. His personal goals continue to be to lose 10 lbs and increase his strength and stamina. We will continue to monitor his progress as he works towards meeting these goals.     Expected  Outcomes Patient will complete the program meeting both personal and program goals. Patient will complete the program meeting both personal and program goals. Patient will complete the program meeting both personal and program goals.              Core Components/Risk Factors/Patient Goals at Discharge (Final Review):   Goals and Risk Factor Review - 03/05/21 1229       Core Components/Risk Factors/Patient Goals Review   Personal Goals Review Weight Management/Obesity    Review Patient has completed 25 session. His initial weight wast 215.0 lbs and his current weight is 222.6 lbs. He continues to do well in the program with consistent attendance and progression. He was seen in the ED 02/28/21 and treated for COVID with milnupiravir and discharged home. He plans to return after 10 days and symptom free. His blood pressure is at goal.  Patient's activity in CR is continues to be at times limited by his hip pain. His personal goals continue to be to lose 10 lbs and increase his strength and stamina. We will continue to monitor his progress as he works towards meeting these goals.    Expected Outcomes Patient will complete the program meeting both personal and program goals.             ITP Comments:   Comments: ITP REVIEW Pt is making expected progress toward Cardiac Rehab goals after completing 27 sessions. Recommend continued exercise, life style modification, education, and increased stamina and strength.

## 2021-03-18 NOTE — Progress Notes (Signed)
Daily Session Note  Patient Details  Name: John Wilkerson MRN: [id number] Date of Birth: [birth year] Referring Provider:   Flowsheet Row CARDIAC REHAB PHASE II ORIENTATION from 12/29/2020 in [place]  Referring Provider Dr. Saunders Revel       Encounter Date: 03/18/2021  Check In:  Session Check In - 03/18/21 1100       Check-In   Supervising physician immediately available to respond to emergencies [facility] MD immediately available    Physician(s) Dr. Harl Bowie    Location [facility]    Staff Present Hoy Register, MS, ACSM-CEP, Exercise Physiologist;Heather Zigmund Daniel, Exercise Physiologist;Other    Virtual Visit No    Medication changes reported     No    Fall or balance concerns reported    No    Tobacco Cessation No Change    Warm-up and Cool-down Performed as group-led instruction    Resistance Training Performed Yes    VAD Patient? No    PAD/SET Patient? No      Pain Assessment   Currently in Pain? No/denies    Pain Score 0-No pain    Multiple Pain Sites No             Capillary Blood Glucose: No results found for this or any previous visit (from the past 24 hour(s)).    Social History   Tobacco Use  Smoking Status Former   Packs/day: 1.00   Types: Cigarettes  Smokeless Tobacco Never    Goals Met:  Independence with exercise equipment Exercise tolerated well No report of concerns or symptoms today Strength training completed today  Goals Unmet:  Not Applicable  Comments: checkout time is 1200   Dr. Kathie Dike is Medical Director for [facility].

## 2021-03-20 ENCOUNTER — Encounter (HOSPITAL_COMMUNITY)
Admission: RE | Admit: 2021-03-20 | Discharge: 2021-03-20 | Disposition: A | Discharge status: Home / Self Care | Payer: No Typology Code available for payment source | Source: Ambulatory Visit | Attending: Cardiology | Admitting: Cardiology

## 2021-03-20 ENCOUNTER — Encounter (HOSPITAL_COMMUNITY): Payer: No Typology Code available for payment source

## 2021-03-20 VITALS — Ht 71.0 in | Wt 219.8 lb

## 2021-03-20 DIAGNOSIS — I2111 ST elevation (STEMI) myocardial infarction involving right coronary artery: Secondary | ICD-10-CM

## 2021-03-20 DIAGNOSIS — Z955 Presence of coronary angioplasty implant and graft: Secondary | ICD-10-CM

## 2021-03-20 NOTE — Progress Notes (Signed)
Daily Session Note  Patient Details  Name: NOLON YELLIN MRN: [id number] Date of Birth: [birth year] Referring Provider:   Flowsheet Row CARDIAC REHAB PHASE II ORIENTATION from 12/29/2020 in [place]  Referring Provider Dr. Saunders Revel       Encounter Date: 03/20/2021  Check In:  Session Check In - 03/20/21 1100       Check-In   Supervising physician immediately available to respond to emergencies [facility] MD immediately available    Physician(s) Dr. Alda Lea    Location [facility]    Staff Present Geanie Cooley, RN;Dalton Kris Mouton, MS, ACSM-CEP, Exercise Physiologist;Debra Wynetta Emery, RN, BSN    Virtual Visit No    Medication changes reported     No    Fall or balance concerns reported    No    Tobacco Cessation No Change    Warm-up and Cool-down Performed as group-led instruction    Resistance Training Performed Yes    VAD Patient? No    PAD/SET Patient? No      Pain Assessment   Currently in Pain? No/denies    Pain Score 0-No pain    Multiple Pain Sites No             Capillary Blood Glucose: No results found for this or any previous visit (from the past 24 hour(s)).    Social History   Tobacco Use  Smoking Status Former   Packs/day: 1.00   Types: Cigarettes  Smokeless Tobacco Never    Goals Met:  Independence with exercise equipment Exercise tolerated well No report of concerns or symptoms today Strength training completed today  Goals Unmet:  Not Applicable  Comments: check out @ 12:00pm   Dr. Kathie Dike is Medical Director for [facility].

## 2021-03-23 ENCOUNTER — Encounter (HOSPITAL_COMMUNITY): Payer: No Typology Code available for payment source

## 2021-03-23 NOTE — Progress Notes (Signed)
Discharge Progress Report  Patient Details  Name: John Wilkerson MRN: [id number] Date of Birth: 08-13-1960 Referring Provider:   Flowsheet Row CARDIAC REHAB PHASE II ORIENTATION from 12/29/2020 in [facility]  Referring Provider Dr. Saunders Revel        Number of Visits: 29  Reason for Discharge:  Patient reached a stable level of exercise. Patient independent in their exercise. Patient has met program and personal goals.  Smoking History:  Social History   Tobacco Use  Smoking Status Former   Packs/day: 1.00   Types: Cigarettes  Smokeless Tobacco Never    Diagnosis:  ST elevation myocardial infarction involving right coronary artery (HCC)  Status post coronary artery stent placement  ADL [facility]:   Initial Exercise Prescription:  Initial Exercise Prescription - 12/29/20 1400       Date of Initial Exercise RX and Referring Provider   Date 12/29/20    Referring Provider Dr. Saunders Revel    Expected Discharge Date 03/23/21      Treadmill   MPH 1.7    Grade 0    Minutes 17      NuStep   Level 1    SPM 70    Minutes 22      Prescription Details   Frequency (times per week) 3    Duration Progress to 30 minutes of continuous aerobic without signs/symptoms of physical distress      Intensity   THRR 40-80% of Max Heartrate 64-127    Ratings of Perceived Exertion 11-15    Perceived Dyspnea 0-4      Resistance Training   Training Prescription Yes    Weight 3    Reps 10-15             Discharge Exercise Prescription (Final Exercise Prescription Changes):  Exercise Prescription Changes - 03/13/21 1100       Response to Exercise   Blood Pressure (Admit) 124/50    Blood Pressure (Exercise) 116/52    Blood Pressure (Exit) 136/80    Heart Rate (Admit) 74 bpm    Heart Rate (Exercise) 88 bpm    Heart Rate (Exit) 81 bpm    Rating of Perceived Exertion (Exercise) 13    Duration Continue with 30 min of aerobic exercise without signs/symptoms of  physical distress.    Intensity THRR unchanged      Progression   Progression Continue to progress workloads to maintain intensity without signs/symptoms of physical distress.      Resistance Training   Training Prescription Yes    Weight 4 lbs    Reps 10-15    Time 10 Minutes      Treadmill   MPH 1.7    Grade 0    Minutes 22    METs 2.3      NuStep   Level 1    SPM 96    Minutes 17    METs 2.2             Functional Capacity:  6 Minute Walk     Row Name 12/29/20 1404 03/20/21 1130       6 Minute Walk   Phase Initial Discharge    Distance 1250 feet 1300 feet    Distance % Change -- 1.2 %    Distance Feet Change -- 50 ft    Walk Time 6 minutes 6 minutes    # of Rest Breaks 0 0    MPH 2.4 2.46    METS 3.04 3.08  RPE 14 16    VO2 Peak 10.65 10.77    Symptoms No No    Resting HR 63 bpm 65 bpm    Resting BP 126/64 106/64    Resting Oxygen Saturation  96 % 97 %    Exercise Oxygen Saturation  during 6 min walk 96 % 97 %    Max Ex. HR 75 bpm 79 bpm    Max Ex. BP 145/75 136/72    2 Minute Post BP 130/70 116/60             Psychological, QOL, Others - Outcomes: PHQ 2/9: Depression screen [facility] 2/9 03/23/2021 12/29/2020  Decreased Interest 0 0  Down, Depressed, Hopeless 0 0  PHQ - 2 Score 0 0  Altered sleeping 0 1  Tired, decreased energy 1 2  Change in appetite 1 0  Feeling bad or failure about yourself  0 1  Trouble concentrating 0 0  Moving slowly or fidgety/restless 0 1  Suicidal thoughts 0 0  PHQ-9 Score 2 5  Difficult doing work/chores Not difficult at all Not difficult at all    Quality of Life:  Quality of Life - 03/23/21 1414       Quality of Life   Select Quality of Life   Did not fill out QOL post properly and could not get score.     Quality of Life Scores   Health/Function Pre 22 %    Socioeconomic Pre 18 %    Psych/Spiritual Pre 16.36 %    Family Pre 27.6 %    GLOBAL Pre 20.84 %             Personal Goals: Goals  established at orientation with interventions provided to work toward goal.  Personal Goals and Risk Factors at Admission - 12/29/20 1318       Core Components/Risk Factors/Patient Goals on Admission    Weight Management Yes;Obesity;Weight Loss    Intervention Weight Management: Develop a combined nutrition and exercise program designed to reach desired caloric intake, while maintaining appropriate intake of nutrient and fiber, sodium and fats, and appropriate energy expenditure required for the weight goal.;Weight Management: Provide education and appropriate resources to help participant work on and attain dietary goals.;Weight Management/Obesity: Establish reasonable short term and long term weight goals.;Obesity: Provide education and appropriate resources to help participant work on and attain dietary goals.    Admit Weight 210 lb (95.3 kg)    Goal Weight: Short Term 200 lb (90.7 kg)    Expected Outcomes Short Term: Continue to assess and modify interventions until short term weight is achieved;Long Term: Adherence to nutrition and physical activity/exercise program aimed toward attainment of established weight goal;Weight Maintenance: Understanding of the daily nutrition guidelines, which includes 25-35% calories from fat, 7% or less cal from saturated fats, less than 210m cholesterol, less than 1.5gm of sodium, & 5 or more servings of fruits and vegetables daily;Weight Loss: Understanding of general recommendations for a balanced deficit meal plan, which promotes 1-2 lb weight loss per week and includes a negative energy balance of [phone] kcal/d;Understanding recommendations for meals to include 15-35% energy as protein, 25-35% energy from fat, 35-60% energy from carbohydrates, less than 2032mof dietary cholesterol, 20-35 gm of total fiber daily;Understanding of distribution of calorie intake throughout the day with the consumption of 4-5 meals/snacks    Improve shortness of breath with ADL's  Yes    Intervention Provide education, individualized exercise plan and daily activity instruction to help decrease symptoms of  SOB with activities of daily living.    Expected Outcomes Short Term: Improve cardiorespiratory fitness to achieve a reduction of symptoms when performing ADLs;Long Term: Be able to perform more ADLs without symptoms or delay the onset of symptoms              Personal Goals Discharge:  Goals and Risk Factor Review     Row Name 01/14/21 1406 02/09/21 1336 03/05/21 1229 03/23/21 1418       Core Components/Risk Factors/Patient Goals Review   Personal Goals Review Weight Management/Obesity Weight Management/Obesity Weight Management/Obesity Weight Management/Obesity    Review Patient was referred to CR with STEMI and S/P Stent. He has multiple risk factors for CAD and is participating in the program for risk modification. He has not been diagnosed with DM but is taking on Jardiance for HF. His last A1C was 08/16/20 at 5.6%. His blood pressure is well controlled. His personal goals for the program are to lose 10 lbs; increase his strenght and stamina and increase his energy level. We will continue to monitor his progress as he works towards meeting these goals. Patient has completed 18 sessions gaining 3 lbs since last 30 day review. He is doing well in the program with consistent attendance and progression. His blood pressure is at goal of below 130/80. He had ECHO 10/27 with EF of 55%. Cardiology says he wants b/p below 130/80. Patient's activity in CR is sometimes limited by his hip pain. His personal goals continue to be to lose 10 lbs and increase his strength and stamina. We will continue to monitor his progress as he works towards meeting these goals. Patient has completed 25 session. His initial weight wast 215.0 lbs and his current weight is 222.6 lbs. He continues to do well in the program with consistent attendance and progression. He was seen in the ED 02/28/21 and  treated for COVID with milnupiravir and discharged home. He plans to return after 10 days and symptom free. His blood pressure is at goal.  Patient's activity in CR is continues to be at times limited by his hip pain. His personal goals continue to be to lose 10 lbs and increase his strength and stamina. We will continue to monitor his progress as he works towards meeting these goals. Pt graduated after completing 29 sessions. He gained 5.1 lbs while in the program. Outside of missing several visits with COVID, he had great attendance. All vitals were WNL. He was limited by hip pain and unable to tolerate workload increases. He states that he will continue to exercise by joining [employer].    Expected Outcomes Patient will complete the program meeting both personal and program goals. Patient will complete the program meeting both personal and program goals. Patient will complete the program meeting both personal and program goals. Pt will continue to work towards their goals post discharge.             Exercise Goals and Review:  Exercise Goals     Row Name 12/29/20 1408 01/19/21 1317 02/16/21 1154 03/17/21 1329       Exercise Goals   Increase Physical Activity Yes Yes Yes Yes    Intervention Provide advice, education, support and counseling about physical activity/exercise needs.;Develop an individualized exercise prescription for aerobic and resistive training based on initial evaluation findings, risk stratification, comorbidities and participant's personal goals. -- Provide advice, education, support and counseling about physical activity/exercise needs.;Develop an individualized exercise prescription for aerobic and resistive training based on  initial evaluation findings, risk stratification, comorbidities and participant's personal goals. Provide advice, education, support and counseling about physical activity/exercise needs.;Develop an individualized exercise prescription for aerobic  and resistive training based on initial evaluation findings, risk stratification, comorbidities and participant's personal goals.    Expected Outcomes Short Term: Attend rehab on a regular basis to increase amount of physical activity.;Long Term: Add in home exercise to make exercise part of routine and to increase amount of physical activity.;Long Term: Exercising regularly at least 3-5 days a week. Short Term: Attend rehab on a regular basis to increase amount of physical activity.;Long Term: Add in home exercise to make exercise part of routine and to increase amount of physical activity.;Long Term: Exercising regularly at least 3-5 days a week. Short Term: Attend rehab on a regular basis to increase amount of physical activity.;Long Term: Add in home exercise to make exercise part of routine and to increase amount of physical activity.;Long Term: Exercising regularly at least 3-5 days a week. Short Term: Attend rehab on a regular basis to increase amount of physical activity.;Long Term: Add in home exercise to make exercise part of routine and to increase amount of physical activity.;Long Term: Exercising regularly at least 3-5 days a week.    Increase Strength and Stamina Yes Yes -- Yes    Intervention Provide advice, education, support and counseling about physical activity/exercise needs.;Develop an individualized exercise prescription for aerobic and resistive training based on initial evaluation findings, risk stratification, comorbidities and participant's personal goals. Provide advice, education, support and counseling about physical activity/exercise needs.;Develop an individualized exercise prescription for aerobic and resistive training based on initial evaluation findings, risk stratification, comorbidities and participant's personal goals. Provide advice, education, support and counseling about physical activity/exercise needs.;Develop an individualized exercise prescription for aerobic and  resistive training based on initial evaluation findings, risk stratification, comorbidities and participant's personal goals. Provide advice, education, support and counseling about physical activity/exercise needs.;Develop an individualized exercise prescription for aerobic and resistive training based on initial evaluation findings, risk stratification, comorbidities and participant's personal goals.    Expected Outcomes Short Term: Increase workloads from initial exercise prescription for resistance, speed, and METs.;Short Term: Perform resistance training exercises routinely during rehab and add in resistance training at home;Long Term: Improve cardiorespiratory fitness, muscular endurance and strength as measured by increased METs and functional capacity (6MWT) Short Term: Increase workloads from initial exercise prescription for resistance, speed, and METs.;Short Term: Perform resistance training exercises routinely during rehab and add in resistance training at home;Long Term: Improve cardiorespiratory fitness, muscular endurance and strength as measured by increased METs and functional capacity (6MWT) Short Term: Increase workloads from initial exercise prescription for resistance, speed, and METs.;Short Term: Perform resistance training exercises routinely during rehab and add in resistance training at home;Long Term: Improve cardiorespiratory fitness, muscular endurance and strength as measured by increased METs and functional capacity (6MWT) Short Term: Increase workloads from initial exercise prescription for resistance, speed, and METs.;Short Term: Perform resistance training exercises routinely during rehab and add in resistance training at home;Long Term: Improve cardiorespiratory fitness, muscular endurance and strength as measured by increased METs and functional capacity (6MWT)    Able to understand and use rate of perceived exertion (RPE) scale Yes Yes Yes Yes    Intervention Provide education  and explanation on how to use RPE scale Provide education and explanation on how to use RPE scale Provide education and explanation on how to use RPE scale Provide education and explanation on how to use RPE scale  Expected Outcomes Short Term: Able to use RPE daily in rehab to express subjective intensity level;Long Term:  Able to use RPE to guide intensity level when exercising independently Short Term: Able to use RPE daily in rehab to express subjective intensity level;Long Term:  Able to use RPE to guide intensity level when exercising independently Short Term: Able to use RPE daily in rehab to express subjective intensity level;Long Term:  Able to use RPE to guide intensity level when exercising independently Short Term: Able to use RPE daily in rehab to express subjective intensity level;Long Term:  Able to use RPE to guide intensity level when exercising independently    Knowledge and understanding of Target Heart Rate Range (THRR) Yes Yes Yes Yes    Intervention Provide education and explanation of THRR including how the numbers were predicted and where they are located for reference Provide education and explanation of THRR including how the numbers were predicted and where they are located for reference Provide education and explanation of THRR including how the numbers were predicted and where they are located for reference Provide education and explanation of THRR including how the numbers were predicted and where they are located for reference    Expected Outcomes Short Term: Able to state/look up THRR;Short Term: Able to use daily as guideline for intensity in rehab;Long Term: Able to use THRR to govern intensity when exercising independently Short Term: Able to state/look up THRR;Short Term: Able to use daily as guideline for intensity in rehab;Long Term: Able to use THRR to govern intensity when exercising independently Short Term: Able to state/look up THRR;Short Term: Able to use daily as  guideline for intensity in rehab;Long Term: Able to use THRR to govern intensity when exercising independently Short Term: Able to state/look up THRR;Short Term: Able to use daily as guideline for intensity in rehab;Long Term: Able to use THRR to govern intensity when exercising independently    Able to check pulse independently Yes Yes Yes Yes    Intervention Provide education and demonstration on how to check pulse in carotid and radial arteries.;Review the importance of being able to check your own pulse for safety during independent exercise Provide education and demonstration on how to check pulse in carotid and radial arteries.;Review the importance of being able to check your own pulse for safety during independent exercise Provide education and demonstration on how to check pulse in carotid and radial arteries.;Review the importance of being able to check your own pulse for safety during independent exercise Provide education and demonstration on how to check pulse in carotid and radial arteries.;Review the importance of being able to check your own pulse for safety during independent exercise    Expected Outcomes Short Term: Able to explain why pulse checking is important during independent exercise;Long Term: Able to check pulse independently and accurately Short Term: Able to explain why pulse checking is important during independent exercise;Long Term: Able to check pulse independently and accurately Short Term: Able to explain why pulse checking is important during independent exercise;Long Term: Able to check pulse independently and accurately Short Term: Able to explain why pulse checking is important during independent exercise;Long Term: Able to check pulse independently and accurately    Understanding of Exercise Prescription Yes Yes Yes Yes    Intervention Provide education, explanation, and written materials on patient's individual exercise prescription Provide education, explanation, and  written materials on patient's individual exercise prescription Provide education, explanation, and written materials on patient's individual exercise prescription Provide education,  explanation, and written materials on patient's individual exercise prescription    Expected Outcomes Short Term: Able to explain program exercise prescription;Long Term: Able to explain home exercise prescription to exercise independently Short Term: Able to explain program exercise prescription;Long Term: Able to explain home exercise prescription to exercise independently Short Term: Able to explain program exercise prescription;Long Term: Able to explain home exercise prescription to exercise independently Short Term: Able to explain program exercise prescription;Long Term: Able to explain home exercise prescription to exercise independently             Exercise Goals Re-Evaluation:  Exercise Goals Re-Evaluation     Row Name 01/19/21 1317 02/17/21 0745 03/17/21 1329         Exercise Goal Re-Evaluation   Exercise Goals Review Increase Physical Activity;Increase Strength and Stamina;Able to understand and use rate of perceived exertion (RPE) scale;Knowledge and understanding of Target Heart Rate Range (THRR);Able to check pulse independently;Understanding of Exercise Prescription Increase Physical Activity;Increase Strength and Stamina;Able to understand and use rate of perceived exertion (RPE) scale;Knowledge and understanding of Target Heart Rate Range (THRR);Able to check pulse independently;Understanding of Exercise Prescription Increase Physical Activity;Increase Strength and Stamina;Able to understand and use rate of perceived exertion (RPE) scale;Knowledge and understanding of Target Heart Rate Range (THRR);Able to check pulse independently;Understanding of Exercise Prescription     Comments Pt has completed 10 sessions of cardiac rehab. He is limited by hip pain and sometimes he is unable to walk his normal  speed on the treadmill due to the pain. He is beginning to get his stamina back and is beginning to progress. He is currently exercising at 2.3 METs. Will continue to monitor and progress as able. Pt has completed 21 sessions of cardiac rehab. His continues to be limited by hip pain while walkingnon the treadmill and unable to walk normal speed or increase speed. Pt could progress workload on the stepper. He is currently exercising at 2.30 METs on the treadmill. Will continue to monitor and progress as able. Pt has completed 27 sessions of cardiac rehab. His hip pain continues to limit his progress as he cannot increase his workloads without pain. He is still recovering from a bout of COVID, and he missed several sessions due to this. He is scheduled to graduate from the program this week but I will ask to see if he would like to make up some of his sessions. He is currently exercising at 2.3 METs on the treadmill. Will continue to monitor and progress as able.     Expected Outcomes Through exercise at rehab and at home, the patient will meet their stated goals. Through exercise at rehab and at home, the patient will meet their stated goals. Through exercise at rehab and at home, the patient will meet their stated goals.              Nutrition & Weight - Outcomes:  Pre Biometrics - 12/29/20 1408       Pre Biometrics   Height '5\' 11"'  (1.803 m)    Weight 214 lb 11.7 oz (97.4 kg)    Waist Circumference 43 inches    Hip Circumference 43 inches    Waist to Hip Ratio 1 %    BMI (Calculated) 29.96    Triceps Skinfold 11 mm    % Body Fat 28 %    Grip Strength 38.9 kg    Flexibility 4 in    Single Leg Stand 4 seconds  Post Biometrics - 03/20/21 1132        Post  Biometrics   Height '5\' 11"'  (1.803 m)    Weight 219 lb 12.8 oz (99.7 kg)    Waist Circumference 44 inches    Hip Circumference 44 inches    Waist to Hip Ratio 1 %    BMI (Calculated) 30.67    Triceps Skinfold 14 mm     % Body Fat 29.7 %    Grip Strength 52.8 kg    Flexibility 4 in    Single Leg Stand 4 seconds             Nutrition:  Nutrition Therapy & Goals - 02/11/21 1250       Personal Nutrition Goals   Comments Patient scored 25 on his diet assessment. We offer 2 educational sessions on heart healthy nutrition with handouts.      Intervention Plan   Intervention Nutrition handout(s) given to patient.    Expected Outcomes Short Term Goal: Understand basic principles of dietary content, such as calories, fat, sodium, cholesterol and nutrients.             Nutrition Discharge:  Nutrition Assessments - 03/23/21 1415       MEDFICTS Scores   Pre Score 25    Post Score 12    Score Difference -13             Education Questionnaire Score:  Knowledge Questionnaire Score - 03/23/21 1415       Knowledge Questionnaire Score   Pre Score 23/28    Post Score 21/24             Goals reviewed with patient; copy given to patient. Pt graduated from cardiac rehab after 29 sessions. He was able to increase his walk test distance by 1.2%. His MET level at graduation was 2.68. He states that he will continue to workout by joining [employer] and will do both cardiovascular and resistance training.

## 2021-04-24 ENCOUNTER — Other Ambulatory Visit: Payer: Self-pay

## 2021-04-24 ENCOUNTER — Ambulatory Visit (INDEPENDENT_AMBULATORY_CARE_PROVIDER_SITE_OTHER): Payer: No Typology Code available for payment source | Admitting: Cardiology

## 2021-04-24 VITALS — BP 136/80 | HR 71 | Ht 71.5 in | Wt 224.0 lb

## 2021-04-24 DIAGNOSIS — E782 Mixed hyperlipidemia: Secondary | ICD-10-CM

## 2021-04-24 DIAGNOSIS — I10 Essential (primary) hypertension: Secondary | ICD-10-CM

## 2021-04-24 DIAGNOSIS — I2510 Atherosclerotic heart disease of native coronary artery without angina pectoris: Secondary | ICD-10-CM | POA: Diagnosis not present

## 2021-04-24 DIAGNOSIS — I739 Peripheral vascular disease, unspecified: Secondary | ICD-10-CM

## 2021-04-24 DIAGNOSIS — I2111 ST elevation (STEMI) myocardial infarction involving right coronary artery: Secondary | ICD-10-CM

## 2021-04-24 NOTE — Patient Instructions (Signed)
Follow-Up: Follow up with Dr. Harl Bowie in  June 2023  Any Other Special Instructions Will Be Listed Below (If Applicable).     If you need a refill on your cardiac medications before your next appointment, please call your pharmacy.

## 2021-04-24 NOTE — Progress Notes (Signed)
Clinical Summary John Wilkerson is a 61 y.o.male seen today for follow up of the following medical problems.   1.CAD - admit 08/2020 with inferior STEMI - received DES to RCA, staged PCI to LAD. Diffuse residual disease in RCA treated medically - 08/2020 echo LVEF 50-55%, no WMAs, indet diastolic,  [id number] echo: LVEF 55%, WMAs, grade I dd  - no chest pain. No SOB/DOE - compliant with meds. Some SOB he associates with brillinta but tolerable.   2.HTN - compliant with meds -renal artery [place] was benign  3. CKD   4. Hyperlipidemia -upcoming with pcp next month - 08/16/2020: Cholesterol 223; HDL 34; LDL Cholesterol 153; Triglycerides 180; VLDL 36    5. Chronic diastolic HF  - [id number] echo LVEF 50-55%, no WMAs, indet diastolic,  [id number] echo: LVEF 55%, WMAs, grade I dd - started on jardiance during admission.    6. Leg pains - bilateral calf pain L>R - occurs with walking, usually about 1/4 of a mile. - taking horse chestnut extract.  - wants to wait on ABIs.   7. HTN - home bp's 110s/60s  Past Medical History:  Diagnosis Date   Hiatal hernia      Allergies  Allergen Reactions   Codeine Other (See Comments)    Tunnel vision and blackouts   Doxycycline Rash     Current Outpatient Medications  Medication Sig Dispense Refill   albuterol (VENTOLIN HFA) 108 (90 Base) MCG/ACT inhaler Inhale 1-2 puffs into the lungs every 6 (six) hours as needed for wheezing or shortness of breath. 6.7 g 0   amLODipine (NORVASC) 10 MG tablet Take 1 tablet (10 mg total) by mouth daily. 30 tablet 11   aspirin 81 MG chewable tablet Chew 1 tablet (81 mg total) by mouth daily. 90 tablet 3   atorvastatin (LIPITOR) 80 MG tablet Take 1 tablet (80 mg total) by mouth daily. 30 tablet 11   benzonatate (TESSALON) 100 MG capsule Take 1 capsule (100 mg total) by mouth 3 (three) times daily as needed for cough. 21 capsule 0   carvedilol (COREG) 12.5 MG tablet Take 1 tablet (12.5 mg total) by mouth 2  (two) times daily with a meal. 60 tablet 11   empagliflozin (JARDIANCE) 10 MG TABS tablet Take 1 tablet (10 mg total) by mouth daily. 30 tablet 11   escitalopram (LEXAPRO) 5 MG tablet Take 5 mg by mouth daily.     fluticasone (FLONASE) 50 MCG/ACT nasal spray Place 2 sprays into both nostrils daily for 7 days. 1 g 0   Multiple Vitamin (MULTIVITAMIN ADULT PO) Take 1 tablet by mouth daily.     nitroGLYCERIN (NITROSTAT) 0.4 MG SL tablet Place 1 tablet (0.4 mg total) under the tongue every 5 (five) minutes x 3 doses as needed for chest pain. 100 tablet 1   Omega-3 Fatty Acids (FISH OIL OMEGA-3 PO) Take 2 capsules by mouth daily.     ticagrelor (BRILINTA) 90 MG TABS tablet Take 1 tablet (90 mg total) by mouth 2 (two) times daily. 60 tablet 11   Turmeric (QC TUMERIC COMPLEX PO) Take 1 tablet by mouth daily.     No current facility-administered medications for this visit.     Past Surgical History:  Procedure Laterality Date   CORONARY STENT INTERVENTION N/A 08/20/2020   Procedure: CORONARY STENT INTERVENTION;  Surgeon: Burnell Blanks, MD;  Location: [facility];  Service: Cardiovascular;  Laterality: N/A;   CORONARY/GRAFT ACUTE MI REVASCULARIZATION N/A 08/16/2020  Procedure: Coronary/Graft Acute MI Revascularization;  Surgeon: Nelva Bush, MD;  Location: [facility];  Service: Cardiovascular;  Laterality: N/A;   INTRAVASCULAR ULTRASOUND/IVUS N/A 08/20/2020   Procedure: Intravascular Ultrasound/IVUS;  Surgeon: Burnell Blanks, MD;  Location: [facility];  Service: Cardiovascular;  Laterality: N/A;   LEFT HEART CATH AND CORONARY ANGIOGRAPHY N/A 08/16/2020   Procedure: LEFT HEART CATH AND CORONARY ANGIOGRAPHY;  Surgeon: Nelva Bush, MD;  Location: [facility];  Service: Cardiovascular;  Laterality: N/A;     Allergies  Allergen Reactions   Codeine Other (See Comments)    Tunnel vision and blackouts   Doxycycline Rash      Family History   Problem Relation Age of Onset   AAA (abdominal aortic aneurysm) Father      Social History John Wilkerson reports that he has quit smoking. His smoking use included cigarettes. He smoked an average of 1 pack per day. He has never used smokeless tobacco. John Wilkerson reports that he does not currently use alcohol.   Review of Systems CONSTITUTIONAL: No weight loss, fever, chills, weakness or fatigue.  HEENT: Eyes: No visual loss, blurred vision, double vision or yellow sclerae.No hearing loss, sneezing, congestion, runny nose or sore throat.  SKIN: No rash or itching.  CARDIOVASCULAR: per hpi RESPIRATORY:per hpi GASTROINTESTINAL: No anorexia, nausea, vomiting or diarrhea. No abdominal pain or blood.  GENITOURINARY: No burning on urination, no polyuria NEUROLOGICAL: No headache, dizziness, syncope, paralysis, ataxia, numbness or tingling in the extremities. No change in bowel or bladder control.  MUSCULOSKELETAL: No muscle, back pain, joint pain or stiffness.  LYMPHATICS: No enlarged nodes. No history of splenectomy.  PSYCHIATRIC: No history of depression or anxiety.  ENDOCRINOLOGIC: No reports of sweating, cold or heat intolerance. No polyuria or polydipsia.  Marland Kitchen   Physical Examination Today's Vitals   04/24/21 1140  BP: 136/80  Pulse: 71  SpO2: 97%  Weight: 224 lb (101.6 kg)  Height: 5' 11.5" (1.816 m)   Body mass index is 30.81 kg/m.  Gen: resting comfortably, no acute distress HEENT: no scleral icterus, pupils equal round and reactive, no palptable cervical adenopathy,  CV: RRR, no m/r/g no jvd Resp: Clear to auscultation bilaterally GI: abdomen is soft, non-tender, non-distended, normal bowel sounds, no hepatosplenomegaly MSK: extremities are warm, no edema.  Skin: warm, no rash Neuro:  no focal deficits Psych: appropriate affect   Diagnostic Studies  08/20/2020 Conclusion     Mid LAD-1 lesion is 75% stenosed. A drug-eluting stent was successfully placed using a  STENT RESOLUTE ONYX 3.5X22. Post intervention, there is a 0% residual stenosis.   1. Severe mid LAD stenosis 2. Successful PTCA/DES x 1 mid LAD   Recommendations: Continue DAPT with ASA/Brilinta for 12 months   Diagnostic Dominance: Right Intervention               Renal artery duplex 08/18/2020 Summary: Renal: Mesenteric: Normal Celiac artery findings. 70 to 99% stenosis in the superior mesenteric artery. *See table(s) above for measurements and observations. Diagnosing physician: Jamelle Haring Electronically signed by Jamelle Haring on 08/18/2020 at 2:53:10 PM. Right: No evidence of right renal artery stenosis. Normal right Resisitive Index. RRV flow present. 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 Left: No evidence of left renal artery stenosis. Normal left Resistive Index. LRV flow present.           Echocardiogram 08/16/2020  1. Left ventricular ejection fraction, by estimation, is 50 to 55%. The left  ventricle has low normal function. The left ventricle has no regional wall motion abnormalities. There is severe left ventricular hypertrophy. Left ventricular diastolic parameters are indeterminate, but suggestive of restrictive diastolic filling pattern. 2. Right ventricular systolic function is normal. The right ventricular size is normal. Tricuspid regurgitation signal is inadequate for assessing PA pressure. 3. The mitral valve is grossly normal. Mild mitral valve regurgitation. 4. The aortic valve is tricuspid. Aortic valve regurgitation is not visualized. 5. The inferior vena cava is normal in size with greater than 50% respiratory variability, suggesting right atrial pressure of 3 mmHg.         Cardiac catheterization 08/16/2020 Coronary/Graft Acute MI Revascularization  LEFT HEART CATH AND CORONARY ANGIOGRAPHY    Conclusion   Conclusions: Multivessel coronary artery disease.  Culprit lesion for the patient's inferior STEMI  is 95% thrombotic mid/distal RCA stenosis.  In addition, there is 70-80% mid LAD disease, 50% proximal LCx stenosis, and 60% distal RCA lesion extending to the bifurcation followed by moderate to severe RPDA and RPL disease. Severely elevated left ventricular filling pressure (LVEDP 35 mmHg). Successful PCI to mid/distal RCA using Resolute Onyx 3.5 x 18 mm drug-eluting stent (postdilated to 4.1 mm) with 0% residual stenosis and TIMI-3 flow. Brachioradial artery precluding advancement of 20F guide catheter.  Consider using alternative access for future catheterizations.   Recommendations: Dual antiplatelet therapy with aspirin and ticagrelor for at least 12 months. Continue tirofiban infusion for 4 hours. Obtain echocardiogram.  Aggressive secondary prevention, including high intensity statin therapy and smoking cessation. Trend high-sensitivity troponin I until it has peaked, then stop. Check hemoglobin A1c. Anticipate staged PCI to LAD (could be performed this admission or shortly after discharge if patient is asymptomatic) based on hospital course.   Diagnostic Dominance: Right Intervention       Assessment and Plan   CAD - STEMI 08/2020, with PCI to RCA and staged PCI to LAD  - no symptoms, continue current meds - will be able to stop brillinta in June  2. Hyperlipidemia - upcoming labs with pcp, will f/u results  3. HTN  - essentially at goal, continue current meds  4. Claudication  Symptoms suggestive of claudication, he is not interested in ABIs at this time - continue to monitor  F/u 08/2021   Arnoldo Lenis, M.D.

## 2021-07-07 ENCOUNTER — Other Ambulatory Visit: Payer: Self-pay | Admitting: Physician Assistant

## 2021-09-01 ENCOUNTER — Encounter: Payer: Self-pay | Admitting: Student

## 2021-09-01 ENCOUNTER — Ambulatory Visit (INDEPENDENT_AMBULATORY_CARE_PROVIDER_SITE_OTHER): Payer: No Typology Code available for payment source | Admitting: Student

## 2021-09-01 VITALS — BP 132/78 | HR 65 | Ht 71.0 in | Wt 237.0 lb

## 2021-09-01 DIAGNOSIS — E785 Hyperlipidemia, unspecified: Secondary | ICD-10-CM

## 2021-09-01 DIAGNOSIS — I255 Ischemic cardiomyopathy: Secondary | ICD-10-CM | POA: Diagnosis not present

## 2021-09-01 DIAGNOSIS — I2510 Atherosclerotic heart disease of native coronary artery without angina pectoris: Secondary | ICD-10-CM

## 2021-09-01 DIAGNOSIS — I10 Essential (primary) hypertension: Secondary | ICD-10-CM

## 2021-09-01 NOTE — Patient Instructions (Signed)
Medication Instructions:  Your physician has recommended you make the following change in your medication:  Stop Brilinta after you finish your current bottle.   Labwork: None  Testing/Procedures: None  Follow-Up: Follow up with Dr. Wyline Mood in 6 months.   Any Other Special Instructions Will Be Listed Below (If Applicable).     If you need a refill on your cardiac medications before your next appointment, please call your pharmacy.

## 2021-09-01 NOTE — Progress Notes (Signed)
Cardiology Office Note    Date:  09/01/2021   ID:  John Wilkerson, DOB [birth year], MRN [id number]  PCP:  Earney Mallet, MD  Cardiologist: Carlyle Dolly, MD    Chief Complaint  Patient presents with   Follow-up    4 month visit    History of Present Illness:    John Wilkerson is a 61 y.o. male past medical history of CAD (s/p Inferior STEMI in 08/2020 with DES to mid-distal RCA and staged PCI of DES to LAD), HFmrEF (EF 50-55% in 08/2020, at 55% in 12/2020), HTN, HLD, Stage 3 CKD and prior tobacco use who presents to the office today for 81-month follow-up.   He was last examined by Dr. Harl Bowie in 04/2020 and denied any recent chest pain or dyspnea on exertion. He was continued on his current medications with plans to stop Brilinta in June since he would be a year out from his ACS event. He did report intermittent leg pain concerning for claudication but did not wish to pursue ABI's at that time.    In talking with the patient today, he reports exercising routinely by walking on the treadmill or using an exercise bike and denies any recent chest pain or dyspnea on exertion with these activities. No recent orthopnea, PND or palpitations. He does experience intermittent lower extremity edema but says this is typically most notable at the end of the day. He does report cramping along his legs at times and this has improved since being placed on Horse Chestnut supplementation by his PCP.  He has gained approximately 20 pounds since his MI last year but reports being under increased stress earlier this year as his mother passed away from cancer. Was previously following a Mediterranean diet with successful weight loss. He has not smoked cigarettes since his MI last year.    Past Medical History:  Diagnosis Date   CAD (coronary artery disease)    a. s/p Inferior STEMI in 08/2020 with DES to mid-distal RCA and staged PCI ot DES to LAD   Hiatal hernia     Past Surgical History:  Procedure  Laterality Date   CORONARY STENT INTERVENTION N/A 08/20/2020   Procedure: CORONARY STENT INTERVENTION;  Surgeon: Burnell Blanks, MD;  Location: [facility];  Service: Cardiovascular;  Laterality: N/A;   CORONARY/GRAFT ACUTE MI REVASCULARIZATION N/A 08/16/2020   Procedure: Coronary/Graft Acute MI Revascularization;  Surgeon: Nelva Bush, MD;  Location: [facility];  Service: Cardiovascular;  Laterality: N/A;   INTRAVASCULAR ULTRASOUND/IVUS N/A 08/20/2020   Procedure: Intravascular Ultrasound/IVUS;  Surgeon: Burnell Blanks, MD;  Location: [facility];  Service: Cardiovascular;  Laterality: N/A;   LEFT HEART CATH AND CORONARY ANGIOGRAPHY N/A 08/16/2020   Procedure: LEFT HEART CATH AND CORONARY ANGIOGRAPHY;  Surgeon: Nelva Bush, MD;  Location: [facility];  Service: Cardiovascular;  Laterality: N/A;    Current Medications: Outpatient Medications Prior to Visit  Medication Sig Dispense Refill   amLODipine (NORVASC) 10 MG tablet TAKE 1 TABLET BY MOUTH EVERY DAY 90 tablet 3   Ascorbic Acid (VITAMIN C) 1000 MG tablet Take 1,000 mg by mouth daily.     aspirin 81 MG chewable tablet Chew 1 tablet (81 mg total) by mouth daily. 90 tablet 3   atorvastatin (LIPITOR) 80 MG tablet TAKE 1 TABLET BY MOUTH EVERY DAY 90 tablet 3   carvedilol (COREG) 12.5 MG tablet TAKE 1 TABLET BY MOUTH TWICE A DAY 180 tablet 3  empagliflozin (JARDIANCE) 10 MG TABS tablet Take 1 tablet (10 mg total) by mouth daily. 30 tablet 11   escitalopram (LEXAPRO) 5 MG tablet Take 5 mg by mouth daily.     HORSE CHESTNUT PO Take by mouth.     Multiple Vitamin (MULTIVITAMIN ADULT PO) Take 1 tablet by mouth daily.     nitroGLYCERIN (NITROSTAT) 0.4 MG SL tablet Place 1 tablet (0.4 mg total) under the tongue every 5 (five) minutes x 3 doses as needed for chest pain. 100 tablet 1   Omega-3 Fatty Acids (FISH OIL OMEGA-3 PO) Take 2 capsules by mouth daily.     Turmeric (QC TUMERIC COMPLEX PO) Take 1  tablet by mouth daily.     ticagrelor (BRILINTA) 90 MG TABS tablet Take 1 tablet (90 mg total) by mouth 2 (two) times daily. 60 tablet 11   albuterol (VENTOLIN HFA) 108 (90 Base) MCG/ACT inhaler Inhale 1-2 puffs into the lungs every 6 (six) hours as needed for wheezing or shortness of breath. (Patient not taking: Reported on 04/24/2021) 6.7 g 0   benzonatate (TESSALON) 100 MG capsule Take 1 capsule (100 mg total) by mouth 3 (three) times daily as needed for cough. (Patient not taking: Reported on 04/24/2021) 21 capsule 0   fluticasone (FLONASE) 50 MCG/ACT nasal spray Place 2 sprays into both nostrils daily for 7 days. (Patient not taking: Reported on 04/24/2021) 1 g 0   No facility-administered medications prior to visit.     Allergies:   Codeine and Doxycycline   Social History   Socioeconomic History   Marital status: Married    Spouse name: Not on file   Number of children: Not on file   Years of education: Not on file   Highest education level: Not on file  Occupational History   Not on file  Tobacco Use   Smoking status: Former    Packs/day: 1.00    Types: Cigarettes   Smokeless tobacco: Never  Substance and Sexual Activity   Alcohol use: Not Currently   Drug use: Not Currently   Sexual activity: Not on file  Other Topics Concern   Not on file  Social History Narrative   Not on file   Social Determinants of Health   Financial Resource Strain: Not on file  Food Insecurity: Not on file  Transportation Needs: Not on file  Physical Activity: Not on file  Stress: Not on file  Social Connections: Not on file     Family History:  The patient's family history includes AAA (abdominal aortic aneurysm) in his father.   Review of Systems:    Please see the history of present illness.     All other systems reviewed and are otherwise negative except as noted above.   Physical Exam:    VS:  BP 132/78   Pulse 65   Ht 5\' 11"  (1.803 m)   Wt 237 lb (107.5 kg)   SpO2 96%    BMI 33.05 kg/m    General: Well developed, well nourished,male appearing in no acute distress. Head: Normocephalic, atraumatic. Neck: No carotid bruits. JVD not elevated.  Lungs: Respirations regular and unlabored, without wheezes or rales.  Heart: Regular rate and rhythm. No S3 or S4.  No murmur, no rubs, or gallops appreciated. Abdomen: Appears non-distended. No obvious abdominal masses. Msk:  Strength and tone appear normal for age. No obvious joint deformities or effusions. Extremities: No clubbing or cyanosis. No pitting edema.  Distal pedal pulses are 2+ bilaterally. Neuro: Alert  and oriented X 3. Moves all extremities spontaneously. No focal deficits noted. Psych:  Responds to questions appropriately with a normal affect. Skin: No rashes or lesions noted  Wt Readings from Last 3 Encounters:  09/01/21 237 lb (107.5 kg)  04/24/21 224 lb (101.6 kg)  03/20/21 219 lb 12.8 oz (99.7 kg)     Studies/Labs Reviewed:   EKG:  EKG is not ordered today.  Recent Labs: 02/28/2021: ALT 24; B Natriuretic Peptide 204.0; BUN 17; Creatinine, Ser 1.36; Hemoglobin 13.9; Platelets 215; Potassium 3.6; Sodium 133   Lipid Panel    Component Value Date/Time   CHOL 223 (H) 08/16/2020 0718   TRIG 180 (H) 08/16/2020 0718   HDL 34 (L) 08/16/2020 0718   CHOLHDL 6.6 08/16/2020 0718   VLDL 36 08/16/2020 0718   LDLCALC 153 (H) 08/16/2020 0718    Additional studies/ records that were reviewed today include:   Cardiac Catheterization: 08/2020 Conclusions: Multivessel coronary artery disease.  Culprit lesion for the patient's inferior STEMI is 95% thrombotic mid/distal RCA stenosis.  In addition, there is 70-80% mid LAD disease, 50% proximal LCx stenosis, and 60% distal RCA lesion extending to the bifurcation followed by moderate to severe RPDA and RPL disease. Severely elevated left ventricular filling pressure (LVEDP 35 mmHg). Successful PCI to mid/distal RCA using Resolute Onyx 3.5 x 18 mm  drug-eluting stent (postdilated to 4.1 mm) with 0% residual stenosis and TIMI-3 flow. Brachioradial artery precluding advancement of 7F guide catheter.  Consider using alternative access for future catheterizations.   Recommendations: Dual antiplatelet therapy with aspirin and ticagrelor for at least 12 months. Continue tirofiban infusion for 4 hours. Obtain echocardiogram.  Aggressive secondary prevention, including high intensity statin therapy and smoking cessation. Trend high-sensitivity troponin I until it has peaked, then stop. Check hemoglobin A1c. Anticipate staged PCI to LAD (could be performed this admission or shortly after discharge if patient is asymptomatic) based on hospital course.  Coronary Stent Intervention: 08/2020 Mid LAD-1 lesion is 75% stenosed. A drug-eluting stent was successfully placed using a STENT RESOLUTE ONYX 3.5X22. Post intervention, there is a 0% residual stenosis.   1. Severe mid LAD stenosis 2. Successful PTCA/DES x 1 mid LAD   Recommendations: Continue DAPT with ASA/Brilinta for 12 months.    Echo: 12/2020 IMPRESSIONS     1. Left ventricular ejection fraction, by estimation, is 55%. The left  ventricle has normal function. The left ventricle has no regional wall  motion abnormalities. The left ventricular internal cavity size was mildly  dilated. There is moderate  concentric left ventricular hypertrophy. Left ventricular diastolic  parameters are consistent with Grade I diastolic dysfunction (impaired  relaxation). Normal global longitudinal strain of -16.4%.   2. Right ventricular systolic function is normal. The right ventricular  size is normal. There is normal pulmonary artery systolic pressure. The  estimated right ventricular systolic pressure is 17.4 mmHg.   3. The mitral valve is grossly normal. Mild mitral valve regurgitation.   4. The aortic valve is tricuspid. There is mild calcification of the  aortic valve. Aortic valve  regurgitation is not visualized.   5. The inferior vena cava is normal in size with greater than 50%  respiratory variability, suggesting right atrial pressure of 3 mmHg.   Comparison(s): Prior images reviewed side by side. LVEF now approximately  55% with mild diastolic dysfunction.  Assessment:    1. Coronary artery disease involving native coronary artery of native heart without angina pectoris   2. Ischemic cardiomyopathy   3.  Hyperlipidemia LDL goal <70   4. Essential hypertension      Plan:   In order of problems listed above:  1. CAD - He is s/p Inferior STEMI in 08/2020 with DES to mid-distal RCA and staged PCI of DES to LAD. He remains active at baseline and denies any recent anginal symptoms. - As previously recommended by Dr. Harl Bowie, will have him stop Brilinta at the end of this month after he finishes his current bottle. Continue ASA 81 mg daily, Coreg 12.5 mg twice daily and Atorvastatin 80 mg daily.  2. Ischemic Cardiomyopathy - His EF was at 50-55% in 08/2020, at 55% in 12/2020. He denies any recent orthopnea or PND and appears euvolemic by examination today. Continue Coreg 12.5 mg BID and Jardiance 10mg  daily.   3. HTN - His blood pressure was initially recorded at 140/82, rechecked and improved to 132/78. Continue Amlodipine 10 mg daily and Coreg 12.5 mg twice daily. He is planning to resume a Mediterranean diet and suspect this will help with improvement in his readings as well.  4. HLD - Will request a copy of most recent labs from his PCP as he reports these were obtained last month.  Remains on Atorvastatin 80 mg daily.   Medication Adjustments/Labs and Tests Ordered: Current medicines are reviewed at length with the patient today.  Concerns regarding medicines are outlined above.  Medication changes, Labs and Tests ordered today are listed in the Patient Instructions below. Patient Instructions  Medication Instructions:  Your physician has recommended you  make the following change in your medication:  Stop Brilinta after you finish your current bottle.   Labwork: None  Testing/Procedures: None  Follow-Up: Follow up with Dr. Harl Bowie in 6 months.   Any Other Special Instructions Will Be Listed Below (If Applicable).     If you need a refill on your cardiac medications before your next appointment, please call your pharmacy.    Signed, Erma Heritage, PA-C  09/01/2021 5:22 PM    [facility] [address] [place], [place] [address] Phone: [phone] Fax: [phone]

## 2021-09-12 ENCOUNTER — Other Ambulatory Visit: Payer: Self-pay | Admitting: Physician Assistant

## 2021-09-25 ENCOUNTER — Telehealth: Payer: Self-pay | Admitting: Student

## 2021-09-25 NOTE — Telephone Encounter (Signed)
Pt c/o medication issue:  1. Name of Medication: Jardiance  2. How are you currently taking this medication (dosage and times per day)?   3. Are you having a reaction (difficulty breathing--STAT)?   4. What is your medication issue?  It is too expensive, can it be changed to something else? He also wants to know why is he taking Jardiance?

## 2021-09-25 NOTE — Telephone Encounter (Signed)
Offered pt application for the patient assistance program but he declined. Will send $10 co pay card to see if that will help with the cost.

## 2021-10-24 ENCOUNTER — Other Ambulatory Visit: Payer: Self-pay | Admitting: Physician Assistant

## 2022-02-24 ENCOUNTER — Ambulatory Visit: Payer: No Typology Code available for payment source | Attending: Cardiology | Admitting: Cardiology

## 2022-02-24 ENCOUNTER — Encounter: Payer: Self-pay | Admitting: Cardiology

## 2022-02-24 VITALS — BP 132/70 | HR 72 | Ht 72.0 in | Wt 239.4 lb

## 2022-02-24 DIAGNOSIS — I2510 Atherosclerotic heart disease of native coronary artery without angina pectoris: Secondary | ICD-10-CM | POA: Diagnosis not present

## 2022-02-24 DIAGNOSIS — I10 Essential (primary) hypertension: Secondary | ICD-10-CM

## 2022-02-24 DIAGNOSIS — E782 Mixed hyperlipidemia: Secondary | ICD-10-CM

## 2022-02-24 DIAGNOSIS — I739 Peripheral vascular disease, unspecified: Secondary | ICD-10-CM | POA: Diagnosis not present

## 2022-02-24 DIAGNOSIS — I255 Ischemic cardiomyopathy: Secondary | ICD-10-CM

## 2022-02-24 NOTE — Patient Instructions (Addendum)
Medication Instructions:  Your physician recommends that you continue on your current medications as directed. Please refer to the Current Medication list given to you today.   Labwork: Fasting Lipid Panel- Nothing to eat/drink 6 hours prior to having lab drawn  Testing/Procedures: None  Follow-Up: Follow up with Dr. Branch in 6 months.   Any Other Special Instructions Will Be Listed Below (If Applicable).     If you need a refill on your cardiac medications before your next appointment, please call your pharmacy.  

## 2022-02-24 NOTE — Progress Notes (Signed)
Clinical Summary Mr. Griess is a 61 y.o.male seen today for follow up of the following medical problems.    1.CAD - admit 08/2020 with inferior STEMI - received DES to RCA, staged PCI to LAD. Diffuse residual disease in RCA treated medically - 08/2020 echo LVEF 50-55%, no WMAs, indet diastolic,  12/2020 echo: LVEF 29%, WMAs, grade I dd   - no chest pains, no SOB/DOE - compliant with meds   2.HTN -he is compliantw ith meds -renal artery [place] was benign   3. CKD Most recent Cr has improved to 1.26   4. Hyperlipidemia -upcoming with pcp next month - 08/16/2020: Cholesterol 223; HDL 34; LDL Cholesterol 153; Triglycerides 180; VLDL 36  - 11/2021 TC 142 TG 114 HDL 44 LDL 77   5. Chronic diastolic HF  - 08/2020 echo LVEF 50-55%, no WMAs, indet diastolic,  12/2020 echo: LVEF 79%, WMAs, grade I dd - started on jardiance during admission.   - no recent edema.      6. Leg pains - bilateral calf pain L>R - occurs with walking, usually about 1/4 of a mile. - taking horse chestnut extract.  - wants to wait on ABIs.   - overall stable symptoms.     Past Medical History:  Diagnosis Date   CAD (coronary artery disease)    a. s/p Inferior STEMI in 08/2020 with DES to mid-distal RCA and staged PCI ot DES to LAD   Hiatal hernia      Allergies  Allergen Reactions   Codeine Other (See Comments)    Tunnel vision and blackouts   Doxycycline Rash     Current Outpatient Medications  Medication Sig Dispense Refill   albuterol (VENTOLIN HFA) 108 (90 Base) MCG/ACT inhaler Inhale 1-2 puffs into the lungs every 6 (six) hours as needed for wheezing or shortness of breath. (Patient not taking: Reported on 04/24/2021) 6.7 g 0   amLODipine (NORVASC) 10 MG tablet TAKE 1 TABLET BY MOUTH EVERY DAY 90 tablet 3   Ascorbic Acid (VITAMIN C) 1000 MG tablet Take 1,000 mg by mouth daily.     ASPIRIN LOW DOSE 81 MG chewable tablet TAKE 1 TABLET BY MOUTH EVERY DAY 90 tablet 3   atorvastatin  (LIPITOR) 80 MG tablet TAKE 1 TABLET BY MOUTH EVERY DAY 90 tablet 3   benzonatate (TESSALON) 100 MG capsule Take 1 capsule (100 mg total) by mouth 3 (three) times daily as needed for cough. (Patient not taking: Reported on 04/24/2021) 21 capsule 0   BRILINTA 90 MG TABS tablet TAKE 1 TABLET BY MOUTH TWICE A DAY 60 tablet 11   carvedilol (COREG) 12.5 MG tablet TAKE 1 TABLET BY MOUTH TWICE A DAY 180 tablet 3   escitalopram (LEXAPRO) 5 MG tablet Take 5 mg by mouth daily.     fluticasone (FLONASE) 50 MCG/ACT nasal spray Place 2 sprays into both nostrils daily for 7 days. (Patient not taking: Reported on 04/24/2021) 1 g 0   HORSE CHESTNUT PO Take by mouth.     JARDIANCE 10 MG TABS tablet TAKE 1 TABLET BY MOUTH EVERY DAY 30 tablet 11   Multiple Vitamin (MULTIVITAMIN ADULT PO) Take 1 tablet by mouth daily.     nitroGLYCERIN (NITROSTAT) 0.4 MG SL tablet Place 1 tablet (0.4 mg total) under the tongue every 5 (five) minutes x 3 doses as needed for chest pain. 100 tablet 1   Omega-3 Fatty Acids (FISH OIL OMEGA-3 PO) Take 2 capsules by mouth daily.  Turmeric (QC TUMERIC COMPLEX PO) Take 1 tablet by mouth daily.     No current facility-administered medications for this visit.     Past Surgical History:  Procedure Laterality Date   CORONARY STENT INTERVENTION N/A 08/20/2020   Procedure: CORONARY STENT INTERVENTION;  Surgeon: Burnell Blanks, MD;  Location: [facility];  Service: Cardiovascular;  Laterality: N/A;   CORONARY/GRAFT ACUTE MI REVASCULARIZATION N/A 08/16/2020   Procedure: Coronary/Graft Acute MI Revascularization;  Surgeon: Nelva Bush, MD;  Location: [facility];  Service: Cardiovascular;  Laterality: N/A;   INTRAVASCULAR ULTRASOUND/IVUS N/A 08/20/2020   Procedure: Intravascular Ultrasound/IVUS;  Surgeon: Burnell Blanks, MD;  Location: [facility];  Service: Cardiovascular;  Laterality: N/A;   LEFT HEART CATH AND CORONARY ANGIOGRAPHY N/A 08/16/2020    Procedure: LEFT HEART CATH AND CORONARY ANGIOGRAPHY;  Surgeon: Nelva Bush, MD;  Location: [facility];  Service: Cardiovascular;  Laterality: N/A;     Allergies  Allergen Reactions   Codeine Other (See Comments)    Tunnel vision and blackouts   Doxycycline Rash      Family History  Problem Relation Age of Onset   AAA (abdominal aortic aneurysm) Father      Social History Mr. Chalifoux reports that he has quit smoking. His smoking use included cigarettes. He smoked an average of 1 pack per day. He has never used smokeless tobacco. Mr. Huizar reports that he does not currently use alcohol.   Review of Systems CONSTITUTIONAL: No weight loss, fever, chills, weakness or fatigue.  HEENT: Eyes: No visual loss, blurred vision, double vision or yellow sclerae.No hearing loss, sneezing, congestion, runny nose or sore throat.  SKIN: No rash or itching.  CARDIOVASCULAR: per hpi RESPIRATORY: No shortness of breath, cough or sputum.  GASTROINTESTINAL: No anorexia, nausea, vomiting or diarrhea. No abdominal pain or blood.  GENITOURINARY: No burning on urination, no polyuria NEUROLOGICAL: No headache, dizziness, syncope, paralysis, ataxia, numbness or tingling in the extremities. No change in bowel or bladder control.  MUSCULOSKELETAL: +leg pains  LYMPHATICS: No enlarged nodes. No history of splenectomy.  PSYCHIATRIC: No history of depression or anxiety.  ENDOCRINOLOGIC: No reports of sweating, cold or heat intolerance. No polyuria or polydipsia.  Marland Kitchen   Physical Examination Today's Vitals   02/24/22 1249  BP: 132/70  Pulse: 72  SpO2: 91%  Weight: 239 lb 6.4 oz (108.6 kg)  Height: 6' (1.829 m)   Body mass index is 32.47 kg/m.  Gen: resting comfortably, no acute distress HEENT: no scleral icterus, pupils equal round and reactive, no palptable cervical adenopathy,  CV: RRR, no mrg, no jvd Resp: Clear to auscultation bilaterally GI: abdomen is soft, non-tender,  non-distended, normal bowel sounds, no hepatosplenomegaly MSK: extremities are warm, no edema.  Skin: warm, no rash Neuro:  no focal deficits Psych: appropriate affect   Diagnostic Studies  08/20/2020 Conclusion     Mid LAD-1 lesion is 75% stenosed. A drug-eluting stent was successfully placed using a STENT RESOLUTE ONYX 3.5X22. Post intervention, there is a 0% residual stenosis.   1. Severe mid LAD stenosis 2. Successful PTCA/DES x 1 mid LAD   Recommendations: Continue DAPT with ASA/Brilinta for 12 months   Diagnostic Dominance: Right Intervention               Renal artery duplex 08/18/2020 Summary: Renal: Mesenteric: Normal Celiac artery findings. 70 to 99% stenosis in the superior mesenteric artery. *See table(s) above for measurements and observations. Diagnosing physician: Jamelle Haring Electronically  signed by Jamelle Haring on 08/18/2020 at 2:53:10 PM. Right: No evidence of right renal artery stenosis. Normal right Resisitive Index. RRV flow present. 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 Left: No evidence of left renal artery stenosis. Normal left Resistive Index. LRV flow present.           Echocardiogram 08/16/2020  1. Left ventricular ejection fraction, by estimation, is 50 to 55%. The left ventricle has low normal function. The left ventricle has no regional wall motion abnormalities. There is severe left ventricular hypertrophy. Left ventricular diastolic parameters are indeterminate, but suggestive of restrictive diastolic filling pattern. 2. Right ventricular systolic function is normal. The right ventricular size is normal. Tricuspid regurgitation signal is inadequate for assessing PA pressure. 3. The mitral valve is grossly normal. Mild mitral valve regurgitation. 4. The aortic valve is tricuspid. Aortic valve regurgitation is not visualized. 5. The inferior vena cava is normal in size with greater than 50%  respiratory variability, suggesting right atrial pressure of 3 mmHg.         Cardiac catheterization 08/16/2020 Coronary/Graft Acute MI Revascularization  LEFT HEART CATH AND CORONARY ANGIOGRAPHY    Conclusion   Conclusions: Multivessel coronary artery disease.  Culprit lesion for the patient's inferior STEMI is 95% thrombotic mid/distal RCA stenosis.  In addition, there is 70-80% mid LAD disease, 50% proximal LCx stenosis, and 60% distal RCA lesion extending to the bifurcation followed by moderate to severe RPDA and RPL disease. Severely elevated left ventricular filling pressure (LVEDP 35 mmHg). Successful PCI to mid/distal RCA using Resolute Onyx 3.5 x 18 mm drug-eluting stent (postdilated to 4.1 mm) with 0% residual stenosis and TIMI-3 flow. Brachioradial artery precluding advancement of 29F guide catheter.  Consider using alternative access for future catheterizations.   Recommendations: Dual antiplatelet therapy with aspirin and ticagrelor for at least 12 months. Continue tirofiban infusion for 4 hours. Obtain echocardiogram.  Aggressive secondary prevention, including high intensity statin therapy and smoking cessation. Trend high-sensitivity troponin I until it has peaked, then stop. Check hemoglobin A1c. Anticipate staged PCI to LAD (could be performed this admission or shortly after discharge if patient is asymptomatic) based on hospital course.   Diagnostic Dominance: Right Intervention          Assessment and Plan   CAD - STEMI 08/2020, with PCI to RCA and staged PCI to LAD  - no recent symptoms, continue current meds   2. Hyperlipidemia - LDL was much improved but not quite at goal by 11/2021, recheck lipid panel. Would transition to crestor 40mg  daily if LDL not <70   3. HTN  - he is at goal, continue current meds   4. Claudication  Symptoms suggestive of claudication, he is not interested in ABIs at this time -chronic stable symptoms not life limiting,  continue medical therapy.        Arnoldo Lenis, M.D.

## 2022-07-11 ENCOUNTER — Other Ambulatory Visit: Payer: Self-pay | Admitting: Cardiology

## 2022-08-25 NOTE — Progress Notes (Addendum)
Cardiology Office Note    Date:  08/26/2022  ID:  John Wilkerson, DOB 1961-02-26, MRN [id number] Cardiologist: Dina Rich, MD    History of Present Illness:    John Wilkerson is a 62 y.o. male with past medical history of CAD (s/p Inferior STEMI in 08/2020 with DES to mid-distal RCA and staged PCI of DES to LAD), HFmrEF (EF 50-55% in 08/2020, at 55% in 12/2020), HTN, HLD, Stage 3 CKD and prior tobacco use who presents to the office today for 72-month follow-up.  He was examined by Dr. Wyline Mood in 02/2022 and denied any recent anginal symptoms at that time. He was continued on his current cardiac medications with plans to recheck an FLP and if LDL was not at goal, would transition to Crestor 40 mg daily. He did report lower extremity discomfort which was concerning for claudication but was not interested in ABI's at that time.  In talking with the patient today, he reports overall doing well since his last office visit. He denies any recent chest pain or progressive dyspnea on exertion. No recent palpitations, orthopnea, PND or pitting edema. He reports his father did pass away due to an aortic aneurysm and we discussed screening options for this. He does report his PCP did switch him from Atorvastatin to Crestor a few months ago and the discomfort along his hands and feet improved. He still gets occasional discomfort along his legs with walking and this improves with rest. However, he is able to use the exercise bike without any symptoms.  Studies Reviewed:   EKG: EKG is not ordered today.  Cardiac Catheterization: 08/2020 Conclusions: Multivessel coronary artery disease.  Culprit lesion for the patient's inferior STEMI is 95% thrombotic mid/distal RCA stenosis.  In addition, there is 70-80% mid LAD disease, 50% proximal LCx stenosis, and 60% distal RCA lesion extending to the bifurcation followed by moderate to severe RPDA and RPL disease. Severely elevated left ventricular filling pressure  (LVEDP 35 mmHg). Successful PCI to mid/distal RCA using Resolute Onyx 3.5 x 18 mm drug-eluting stent (postdilated to 4.1 mm) with 0% residual stenosis and TIMI-3 flow. Brachioradial artery precluding advancement of 72F guide catheter.  Consider using alternative access for future catheterizations.   Recommendations: Dual antiplatelet therapy with aspirin and ticagrelor for at least 12 months. Continue tirofiban infusion for 4 hours. Obtain echocardiogram.  Aggressive secondary prevention, including high intensity statin therapy and smoking cessation. Trend high-sensitivity troponin I until it has peaked, then stop. Check hemoglobin A1c. Anticipate staged PCI to LAD (could be performed this admission or shortly after discharge if patient is asymptomatic) based on hospital course.  Coronary Stent Intervention: 08/2020 Mid LAD-1 lesion is 75% stenosed. A drug-eluting stent was successfully placed using a STENT RESOLUTE ONYX 3.5X22. Post intervention, there is a 0% residual stenosis.   1. Severe mid LAD stenosis 2. Successful PTCA/DES x 1 mid LAD   Recommendations: Continue DAPT with ASA/Brilinta for 12 months.   Echocardiogram: 12/2020 IMPRESSIONS     1. Left ventricular ejection fraction, by estimation, is 55%. The left  ventricle has normal function. The left ventricle has no regional wall  motion abnormalities. The left ventricular internal cavity size was mildly  dilated. There is moderate  concentric left ventricular hypertrophy. Left ventricular diastolic  parameters are consistent with Grade I diastolic dysfunction (impaired  relaxation). Normal global longitudinal strain of -16.4%.   2. Right ventricular systolic function is normal. The right ventricular  size is normal. There is normal pulmonary  artery systolic pressure. The  estimated right ventricular systolic pressure is 17.4 mmHg.   3. The mitral valve is grossly normal. Mild mitral valve regurgitation.   4. The aortic  valve is tricuspid. There is mild calcification of the  aortic valve. Aortic valve regurgitation is not visualized.   5. The inferior vena cava is normal in size with greater than 50%  respiratory variability, suggesting right atrial pressure of 3 mmHg.   Comparison(s): Prior images reviewed side by side. LVEF now approximately  55% with mild diastolic dysfunction.    Physical Exam:   VS:  BP 134/76   Pulse 76   Ht 5\' 11"  (1.803 m)   Wt 234 lb (106.1 kg)   SpO2 96%   BMI 32.64 kg/m    Wt Readings from Last 3 Encounters:  08/26/22 234 lb (106.1 kg)  02/24/22 239 lb 6.4 oz (108.6 kg)  09/01/21 237 lb (107.5 kg)     GEN: Pleasant male appearing in no acute distress NECK: No JVD; No carotid bruits CARDIAC: RRR, no murmurs, rubs, gallops RESPIRATORY:  Clear to auscultation without rales, wheezing or rhonchi  ABDOMEN: Appears non-distended. No obvious abdominal masses. EXTREMITIES: No clubbing or cyanosis. No pitting edema.  Distal pedal pulses are 2+ bilaterally.   Assessment and Plan:   1. CAD - He is s/p Inferior STEMI in 08/2020 with DES to mid-distal RCA and staged PCI of DES to LAD.  - He remains active at baseline and denies any recent anginal symptoms. Continue current medical therapy with ASA 81 mg daily, Coreg 12.5 mg twice daily and Crestor 40 mg daily.  2. HTN - BP was initially recorded at 140/74, rechecked and improved to 134/76. Continue current medical therapy with Amlodipine 10 mg daily and Coreg 12.5 mg twice daily.  3. HLD - LDL was at 94 in 05/2022 and he was switched from Atorvastatin to Crestor 40 mg daily by his PCP.  He is scheduled for follow-up labs within the next month per his report. He LDL remains above goal, would recommend the addition of Zetia.   4. Stage 3 CKD - Baseline creatinine 1.2 - 1.3. Improved to 1.06 when checked in 05/2022.  6. Leg Pain - His description of symptoms is concerning for claudication as it occurs with activity and  resolves with rest, however this is sporadic. Symptoms did improve with adjustment of statin therapy as discussed above. He wishes to hold off on additional testing for now and focus on weight loss. I encouraged him to make [place] aware if this progresses as we could arrange for lower extremity ABI's and doppler studies.  7. Screening for AAA - Given his family history of abdominal aortic aneurysm rupture in his father and no prior screening, will order an [place] for screening.     Signed, Ellsworth Lennox, PA-C

## 2022-08-26 ENCOUNTER — Ambulatory Visit: Payer: No Typology Code available for payment source | Attending: Student | Admitting: Student

## 2022-08-26 ENCOUNTER — Encounter: Payer: Self-pay | Admitting: Student

## 2022-08-26 VITALS — BP 134/76 | HR 76 | Ht 71.0 in | Wt 234.0 lb

## 2022-08-26 DIAGNOSIS — N1831 Chronic kidney disease, stage 3a: Secondary | ICD-10-CM

## 2022-08-26 DIAGNOSIS — E785 Hyperlipidemia, unspecified: Secondary | ICD-10-CM | POA: Diagnosis not present

## 2022-08-26 DIAGNOSIS — I2510 Atherosclerotic heart disease of native coronary artery without angina pectoris: Secondary | ICD-10-CM

## 2022-08-26 DIAGNOSIS — I10 Essential (primary) hypertension: Secondary | ICD-10-CM

## 2022-08-26 DIAGNOSIS — M79605 Pain in left leg: Secondary | ICD-10-CM

## 2022-08-26 DIAGNOSIS — M79604 Pain in right leg: Secondary | ICD-10-CM

## 2022-08-26 DIAGNOSIS — Z136 Encounter for screening for cardiovascular disorders: Secondary | ICD-10-CM

## 2022-08-26 NOTE — Patient Instructions (Signed)
Medication Instructions:  Your physician recommends that you continue on your current medications as directed. Please refer to the Current Medication list given to you today.   Labwork: None today  Testing/Procedures: Abdominal ultrasound  Follow-Up: 6 months  Any Other Special Instructions Will Be Listed Below (If Applicable).  If you need a refill on your cardiac medications before your next appointment, please call your pharmacy.

## 2022-09-07 ENCOUNTER — Ambulatory Visit (HOSPITAL_COMMUNITY)
Admission: RE | Admit: 2022-09-07 | Discharge: 2022-09-07 | Disposition: A | Discharge status: Home / Self Care | Payer: No Typology Code available for payment source | Source: Ambulatory Visit | Attending: Student | Admitting: Student

## 2022-09-07 DIAGNOSIS — Z136 Encounter for screening for cardiovascular disorders: Secondary | ICD-10-CM | POA: Insufficient documentation

## 2022-09-07 DIAGNOSIS — I10 Essential (primary) hypertension: Secondary | ICD-10-CM | POA: Diagnosis present

## 2022-09-07 DIAGNOSIS — F17210 Nicotine dependence, cigarettes, uncomplicated: Secondary | ICD-10-CM | POA: Insufficient documentation

## 2022-09-07 DIAGNOSIS — M79605 Pain in left leg: Secondary | ICD-10-CM | POA: Insufficient documentation

## 2022-09-07 DIAGNOSIS — M79604 Pain in right leg: Secondary | ICD-10-CM | POA: Insufficient documentation

## 2022-09-07 DIAGNOSIS — I2510 Atherosclerotic heart disease of native coronary artery without angina pectoris: Secondary | ICD-10-CM | POA: Diagnosis present

## 2022-09-14 ENCOUNTER — Other Ambulatory Visit: Payer: Self-pay | Admitting: *Deleted

## 2022-09-14 MED ORDER — EMPAGLIFLOZIN 10 MG PO TABS: 10.0000 mg | ORAL_TABLET | Freq: Every day | ORAL | 6 refills | Status: DC

## 2022-12-01 ENCOUNTER — Telehealth: Payer: Self-pay | Admitting: Cardiology

## 2022-12-01 MED ORDER — ASPIRIN 81 MG PO CHEW: 81.0000 mg | CHEWABLE_TABLET | Freq: Every day | ORAL | 3 refills | Status: DC

## 2022-12-01 NOTE — Telephone Encounter (Signed)
*  STAT* If patient is at the pharmacy, call can be transferred to refill team.   1. Which medications need to be refilled? (please list name of each medication and dose if known)   ASPIRIN LOW DOSE 81 MG chewable tablet    2. Which pharmacy/location (including street and city if local pharmacy) is medication to be sent to? [id number] - [place], [place] - [address] AT [address]    3. Do they need a 30 day or 90 day supply? 90 day

## 2022-12-01 NOTE — Telephone Encounter (Signed)
Refill complete

## 2023-01-08 IMAGING — CT CT HEAD W/O CM
3 series · 16 of 47 positions shown, 19 images · non-contrast
Comparison: None.

CLINICAL DATA: Delirium.  Somnolence, confusion and agitation.

EXAM:
CT HEAD WITHOUT CONTRAST
TECHNIQUE: Contiguous axial images were obtained from the base of the skull
through the vertex without intravenous contrast.

[Series 3: head 5.0 (person_name)30(person_name) (person_name · axial · 0.43mm/px · z∈[-179,-29]mm · 10 of 36 slices shown, 13 images]
[im 3/36  brain]
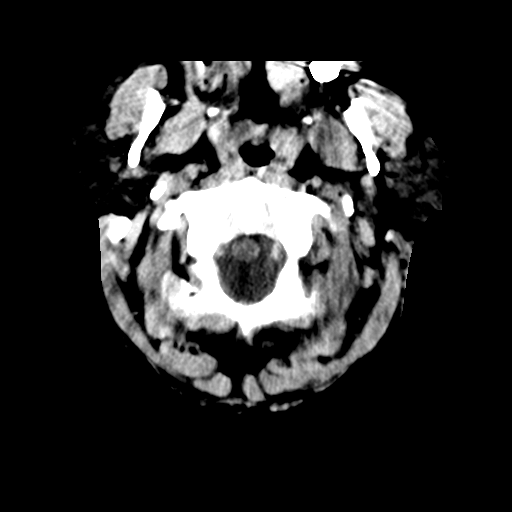
[im 3/36  bone]
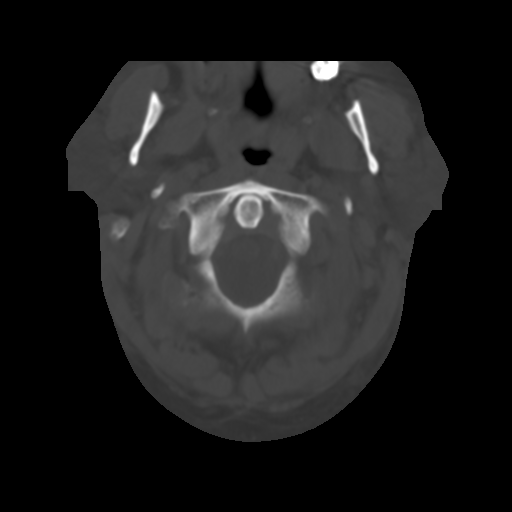
[im 7/36  brain]
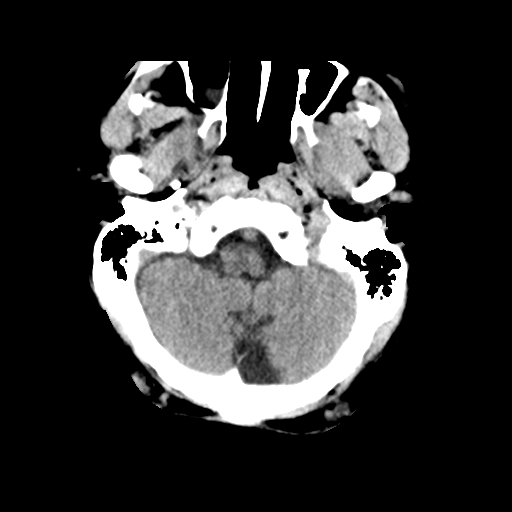
[im 10/36  brain]
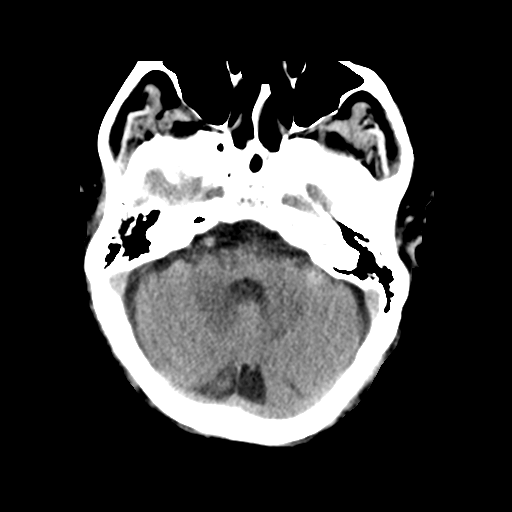
[im 13/36  brain]
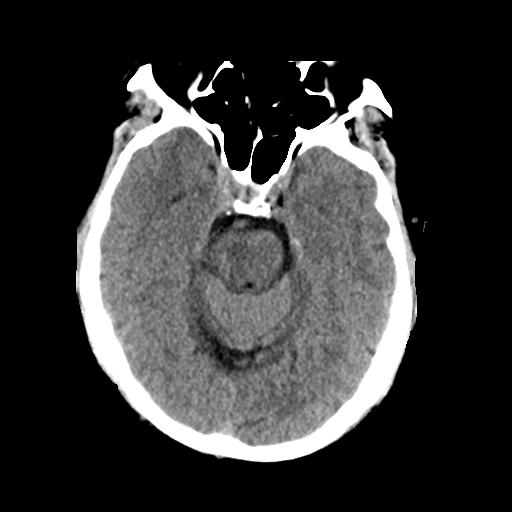
[im 16/36  brain]
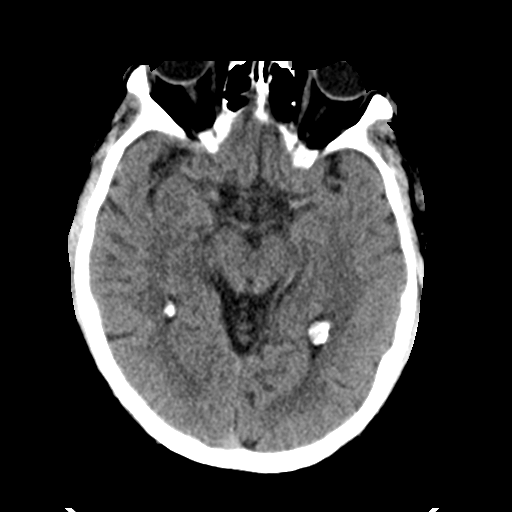
[im 16/36  bone]
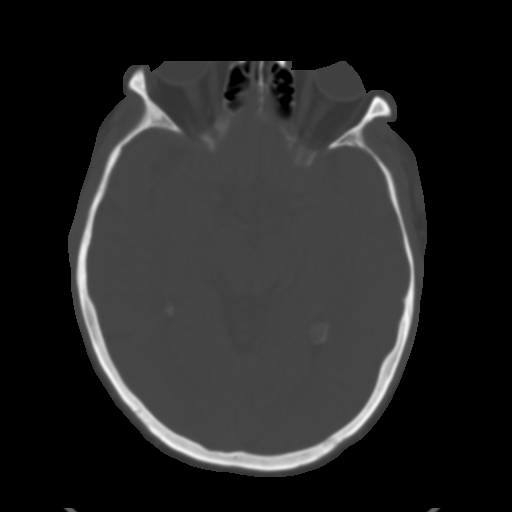
[im 20/36  brain]
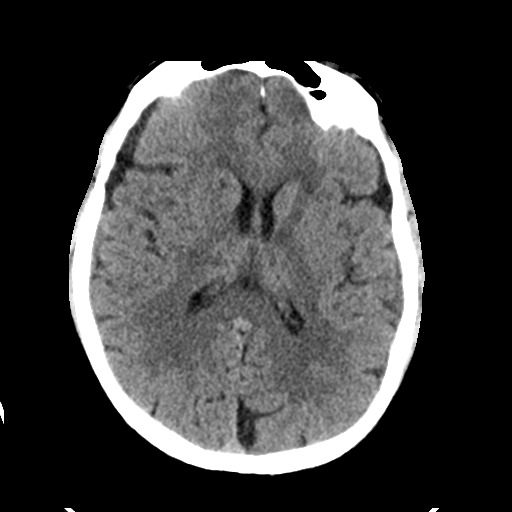
[im 23/36  brain]
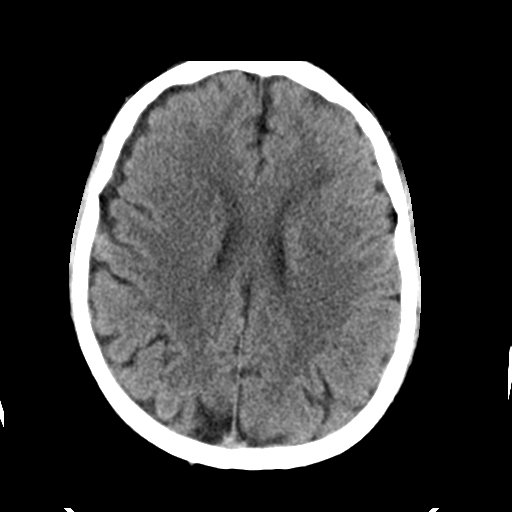
[im 27/36  brain]
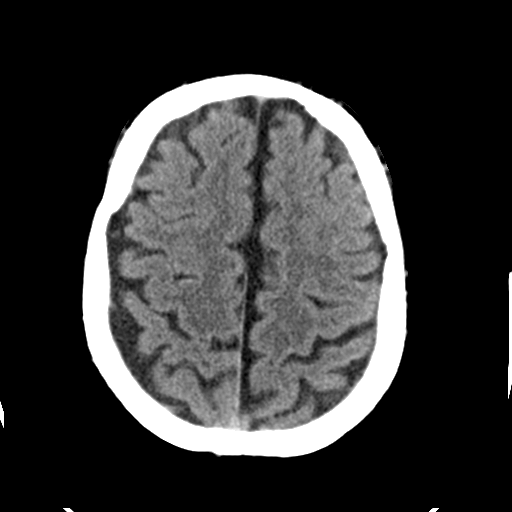
[im 29/36  brain]
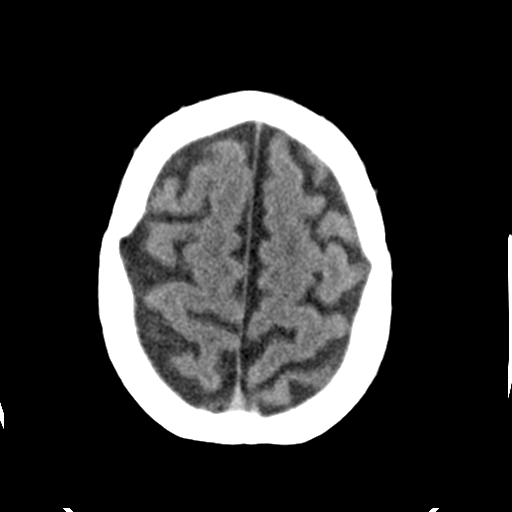
[im 29/36  bone]
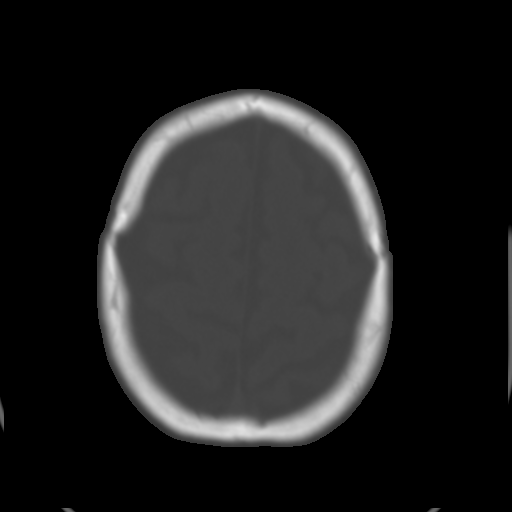
[im 33/36  brain]
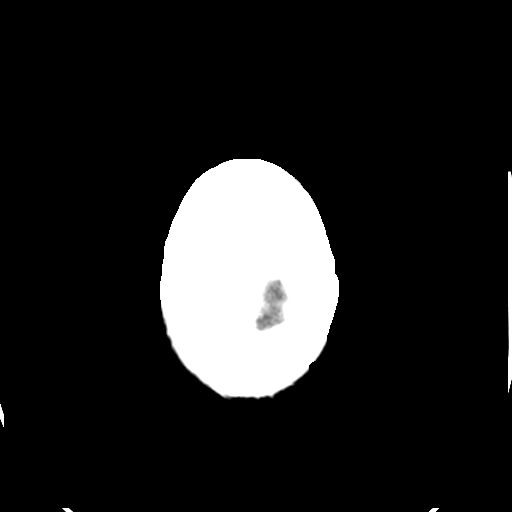

[Series 5: head 3.0 mpr cor · coronal · 0.35mm/px · 3 of 71 slices shown]
[im 24/71  brain]
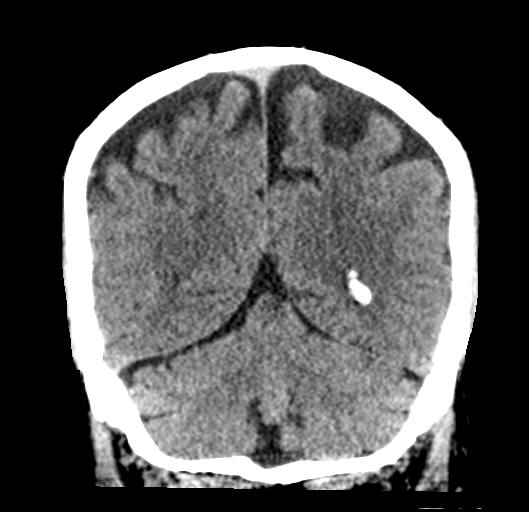
[im 32/71  brain]
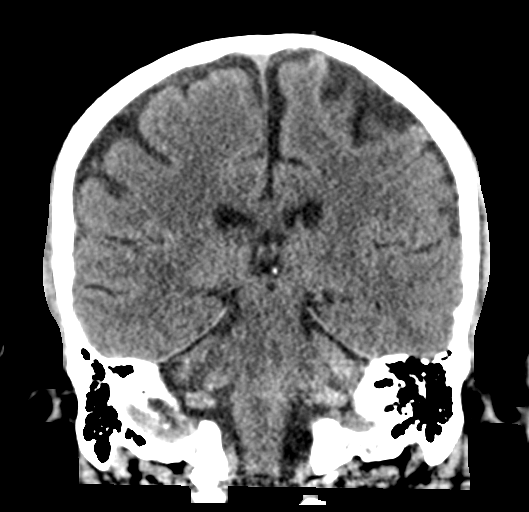
[im 39/71  brain]
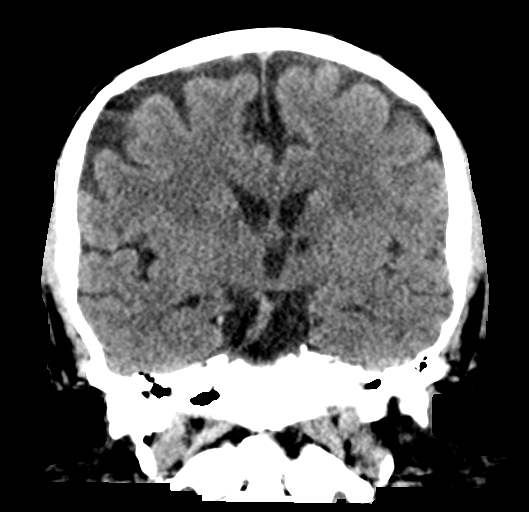

[Series 6: head 3.0 mpr sag · sagittal · 0.35mm/px · 3 of 65 slices shown]
[im 22/65  brain]
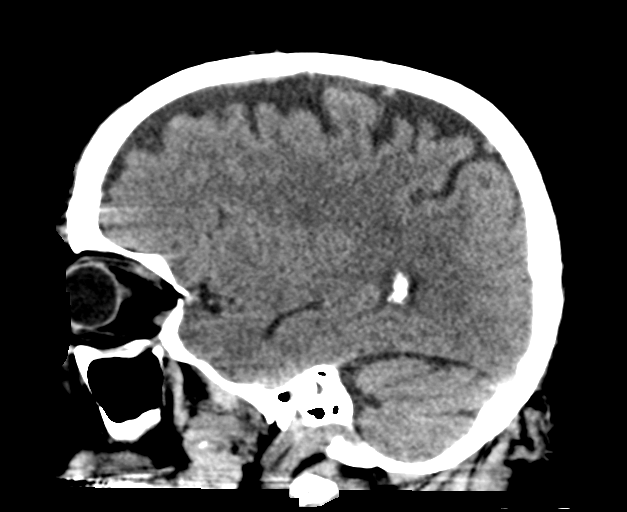
[im 33/65  brain]
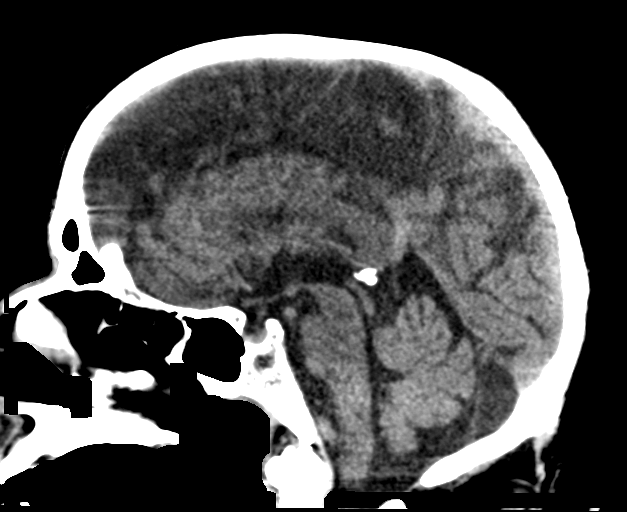
[im 43/65  brain]
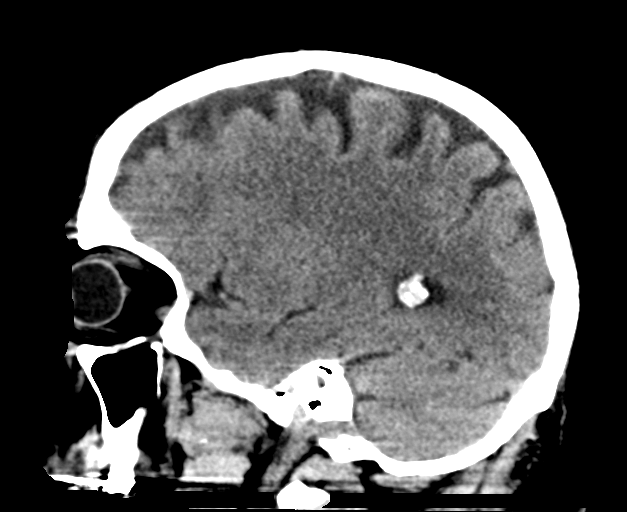

[16 of 47 positions shown; findings below may reference images not displayed]

FINDINGS: Brain: Generalized cerebral atrophy. No intracranial hemorrhage,
mass effect, or midline shift. No hydrocephalus. The basilar
cisterns are patent. Remote lacunar infarcts in the right caudate
and bilateral basal ganglia. No evidence of territorial infarct or
acute ischemia. No extra-axial or intracranial fluid collection.

Vascular: No hyperdense vessel.

Skull: No fracture or focal lesion.

Sinuses/Orbits: Mucous retention cysts in the right maxillary sinus.
Occasional mucosal thickening of ethmoid air cells. Mastoid air
cells are clear. No acute orbital findings.

Other: None.
IMPRESSION: 1. No acute intracranial abnormality.
2. Generalized cerebral atrophy. Remote lacunar infarcts in the
right caudate and bilateral basal ganglia.

## 2023-01-24 ENCOUNTER — Other Ambulatory Visit: Payer: Self-pay

## 2023-01-24 ENCOUNTER — Emergency Department (HOSPITAL_COMMUNITY)
Admission: EM | Admit: 2023-01-24 | Discharge: 2023-01-24 | Disposition: A | Discharge status: Home / Self Care | Payer: No Typology Code available for payment source | Attending: Emergency Medicine | Admitting: Emergency Medicine

## 2023-01-24 ENCOUNTER — Emergency Department (HOSPITAL_COMMUNITY): Payer: No Typology Code available for payment source

## 2023-01-24 DIAGNOSIS — Z7982 Long term (current) use of aspirin: Secondary | ICD-10-CM | POA: Diagnosis not present

## 2023-01-24 DIAGNOSIS — I129 Hypertensive chronic kidney disease with stage 1 through stage 4 chronic kidney disease, or unspecified chronic kidney disease: Secondary | ICD-10-CM | POA: Insufficient documentation

## 2023-01-24 DIAGNOSIS — N189 Chronic kidney disease, unspecified: Secondary | ICD-10-CM | POA: Insufficient documentation

## 2023-01-24 DIAGNOSIS — J189 Pneumonia, unspecified organism: Secondary | ICD-10-CM | POA: Diagnosis not present

## 2023-01-24 DIAGNOSIS — Z1152 Encounter for screening for COVID-19: Secondary | ICD-10-CM | POA: Insufficient documentation

## 2023-01-24 DIAGNOSIS — I2510 Atherosclerotic heart disease of native coronary artery without angina pectoris: Secondary | ICD-10-CM | POA: Diagnosis not present

## 2023-01-24 DIAGNOSIS — F17200 Nicotine dependence, unspecified, uncomplicated: Secondary | ICD-10-CM | POA: Diagnosis not present

## 2023-01-24 DIAGNOSIS — Z79899 Other long term (current) drug therapy: Secondary | ICD-10-CM | POA: Insufficient documentation

## 2023-01-24 DIAGNOSIS — R059 Cough, unspecified: Secondary | ICD-10-CM | POA: Diagnosis present

## 2023-01-24 LAB — RESP PANEL BY RT-PCR (RSV, FLU A&B, COVID)  RVPGX2
Influenza A by PCR: NEGATIVE
Influenza B by PCR: NEGATIVE
Resp Syncytial Virus by PCR: NEGATIVE
SARS Coronavirus 2 by RT PCR: NEGATIVE

## 2023-01-24 MED ORDER — METHYLPREDNISOLONE SODIUM SUCC 125 MG IJ SOLR
125.0000 mg | Freq: Once | INTRAMUSCULAR | Status: AC
  Administered 2023-01-24: 125 mg via INTRAVENOUS
  Filled 2023-01-24: qty 2

## 2023-01-24 MED ORDER — ALBUTEROL SULFATE (2.5 MG/3ML) 0.083% IN NEBU
INHALATION_SOLUTION | RESPIRATORY_TRACT | Status: AC
  Administered 2023-01-24: 10 mg via RESPIRATORY_TRACT
  Filled 2023-01-24: qty 12

## 2023-01-24 MED ORDER — ALBUTEROL (5 MG/ML) CONTINUOUS INHALATION SOLN
7.5000 mg/h | INHALATION_SOLUTION | Freq: Once | RESPIRATORY_TRACT | Status: DC
  Filled 2023-01-24: qty 20

## 2023-01-24 MED ORDER — PREDNISONE 50 MG PO TABS: ORAL_TABLET | ORAL | 0 refills | Status: DC

## 2023-01-24 MED ORDER — SODIUM CHLORIDE 0.9 % IV SOLN
1.0000 g | Freq: Once | INTRAVENOUS | Status: AC
  Administered 2023-01-24: 1 g via INTRAVENOUS
  Filled 2023-01-24: qty 10

## 2023-01-24 MED ORDER — DEXTROSE 5 % IV SOLN
500.0000 mg | Freq: Once | INTRAVENOUS | Status: AC
  Administered 2023-01-24: 500 mg via INTRAVENOUS
  Filled 2023-01-24: qty 5

## 2023-01-24 MED ORDER — ALBUTEROL SULFATE (2.5 MG/3ML) 0.083% IN NEBU: 10.0000 mg | INHALATION_SOLUTION | Freq: Once | RESPIRATORY_TRACT | Status: AC

## 2023-01-24 MED ORDER — AZITHROMYCIN 250 MG PO TABS: 250.0000 mg | ORAL_TABLET | Freq: Every day | ORAL | 0 refills | Status: DC

## 2023-01-24 MED ORDER — ALBUTEROL SULFATE HFA 108 (90 BASE) MCG/ACT IN AERS: 1.0000 | INHALATION_SPRAY | Freq: Four times a day (QID) | RESPIRATORY_TRACT | 0 refills | Status: DC | PRN

## 2023-01-24 MED ORDER — IPRATROPIUM-ALBUTEROL 0.5-2.5 (3) MG/3ML IN SOLN
3.0000 mL | Freq: Once | RESPIRATORY_TRACT | Status: AC
  Administered 2023-01-24: 3 mL via RESPIRATORY_TRACT
  Filled 2023-01-24: qty 3

## 2023-01-24 MED ORDER — ALBUTEROL SULFATE (2.5 MG/3ML) 0.083% IN NEBU
2.5000 mg | INHALATION_SOLUTION | Freq: Once | RESPIRATORY_TRACT | Status: AC
  Administered 2023-01-24: 2.5 mg via RESPIRATORY_TRACT
  Filled 2023-01-24: qty 3

## 2023-01-24 NOTE — ED Notes (Signed)
Breathing treatment complete - pt placed back on 3L [place].

## 2023-01-24 NOTE — Discharge Instructions (Addendum)
As discussed, your chest x-ray is negative, however your symptoms in your exam suggest that you have an early pneumonia.  You are being covered with antibiotics, you have received antibiotics here but will continue at home with Zithromax tablets, take your next dose tomorrow evening.  I also prescribed you prednisone to help with the wheezing, take your next dose tomorrow evening as well.  I have prescribed you a new albuterol inhaler for you to use since your current 1 is old, you may use this medicine as needed for wheezing or shortness of breath.  Plan close follow-up with your primary doctor or return here if your symptoms worsen in any way including increasing shortness of breath or weakness.

## 2023-01-24 NOTE — ED Notes (Signed)
Pt ambulated in the room on RA and remained between 90-94% oxygen saturation level - no dizziness nor tachypnea. Provider made aware.

## 2023-01-24 NOTE — ED Triage Notes (Signed)
Pt to er, pt states that he had a dry throat on Saturday, then a dry cough, states that Sunday he was congested, states that today he has chills, fatigue and body aches.

## 2023-01-24 NOTE — ED Notes (Signed)
Pt increased to 4L [place] due to sats on 3L being 88-90%; Pt sats improved to 93-94% on 4L.

## 2023-01-24 NOTE — ED Notes (Signed)
RT called regarding the continuous neb.

## 2023-01-24 NOTE — ED Provider Notes (Signed)
[place] [facility] DEPARTMENT AT [facility] Provider Note   CSN: [id number] Arrival date & time: 01/24/23  1449     History  Chief Complaint  Patient presents with   Cough    John Wilkerson is a 61 y.o. male with a prior history including hypertension, chronic kidney disease, CAD and hyperlipidemia, also a 1/2 pack/day smoker presenting for evaluation of a 3-day history of URI type symptoms.  His symptoms started as a dry scratchy throat that he developed a dry cough which has become a productive cough for the past 2 days.  He reports shortness of breath, wheezing, generalized bodyaches along with chills without documented fever.  To his knowledge he has not been around others with COVID or influenza.  He denies chest pain, nausea vomiting, abdominal pain, palpitations.  His breathing is comfortable at rest but states he gets short of breath with exertion.  He has an old albuterol MDI from a prior respiratory infection, he has used this with some transient improvement in his symptoms.   The history is provided by the patient and the spouse.       Home Medications Prior to Admission medications   Medication Sig Start Date End Date Taking? Authorizing Provider  albuterol (VENTOLIN HFA) 108 (90 Base) MCG/ACT inhaler Inhale 1-2 puffs into the lungs every 6 (six) hours as needed for wheezing or shortness of breath. 01/24/23  Yes Bea Duren, Raynelle Fanning, PA-C  azithromycin (ZITHROMAX) 250 MG tablet Take 1 tablet (250 mg total) by mouth daily. 1 tablet daily for 4 days. 01/24/23  Yes Willella Harding, Raynelle Fanning, PA-C  predniSONE (DELTASONE) 50 MG tablet One tablet daily by mouth for 4 days. 01/24/23  Yes Elizabeth Haff, Raynelle Fanning, PA-C  amLODipine (NORVASC) 10 MG tablet TAKE 1 TABLET BY MOUTH EVERY DAY 07/12/22   Antoine Poche, MD  Ascorbic Acid (VITAMIN C) 1000 MG tablet Take 1,000 mg by mouth daily.    [provider]  aspirin (ASPIRIN LOW DOSE) 81 MG chewable tablet Chew 1 tablet (81 mg total) by mouth  daily. 12/01/22   Antoine Poche, MD  carvedilol (COREG) 12.5 MG tablet TAKE 1 TABLET BY MOUTH TWICE A DAY 07/12/22   Antoine Poche, MD  empagliflozin (JARDIANCE) 10 MG TABS tablet Take 1 tablet (10 mg total) by mouth daily. 09/14/22   Antoine Poche, MD  escitalopram (LEXAPRO) 5 MG tablet Take 5 mg by mouth daily.    [provider]  montelukast (SINGULAIR) 10 MG tablet Take 10 mg by mouth daily. 12/01/21   [provider]  Multiple Vitamin (MULTIVITAMIN ADULT PO) Take 1 tablet by mouth daily.    [provider]  nitroGLYCERIN (NITROSTAT) 0.4 MG SL tablet Place 1 tablet (0.4 mg total) under the tongue every 5 (five) minutes x 3 doses as needed for chest pain. 08/21/20   Bhagat, Sharrell Ku, PA  Omega-3 Fatty Acids (FISH OIL OMEGA-3 PO) Take 2 capsules by mouth daily.    [provider]  rosuvastatin (CRESTOR) 40 MG tablet Take 40 mg by mouth daily. 06/09/22   [provider]      Allergies    Codeine and Doxycycline    Review of Systems   Review of Systems  Constitutional:  Positive for chills. Negative for fever.  HENT:  Positive for congestion and sore throat. Negative for ear pain, rhinorrhea, sinus pressure, trouble swallowing and voice change.   Eyes:  Negative for discharge.  Respiratory:  Positive for cough and shortness of breath.  Negative for wheezing and stridor.   Cardiovascular:  Negative for chest pain.  Gastrointestinal:  Negative for abdominal pain, nausea and vomiting.  Genitourinary: Negative.     Physical Exam Updated Vital Signs BP 138/74   Pulse 82   Temp 98.9 F (37.2 C) (Oral)   Resp 18   Ht 5\' 11"  (1.803 m)   Wt 108.9 kg   SpO2 93%   BMI 33.47 kg/m  Physical Exam Vitals and nursing note reviewed.  Constitutional:      Appearance: He is well-developed.  HENT:     Head: Normocephalic and atraumatic.     Mouth/Throat:     Mouth: Mucous membranes are moist.     Pharynx: No oropharyngeal exudate or  posterior oropharyngeal erythema.  Eyes:     Conjunctiva/sclera: Conjunctivae normal.  Cardiovascular:     Rate and Rhythm: Normal rate and regular rhythm.     Heart sounds: Normal heart sounds.  Pulmonary:     Effort: Respiratory distress present.     Breath sounds: Wheezing and rhonchi present. No rales.     Comments: Initial pulse ox 91% on room air.  Expiratory wheeze with prolonged expirations, rhonchi noted  right lower lung field. Abdominal:     General: Bowel sounds are normal.     Palpations: Abdomen is soft.     Tenderness: There is no abdominal tenderness.  Musculoskeletal:        General: Normal range of motion.     Cervical back: Normal range of motion.  Skin:    General: Skin is warm and dry.  Neurological:     Mental Status: He is alert.     ED Results / Procedures / Treatments   Labs (all labs ordered are listed, but only abnormal results are displayed) Labs Reviewed  RESP PANEL BY RT-PCR (RSV, FLU A&B, COVID)  RVPGX2    EKG None  Radiology Results for orders placed or performed during the hospital encounter of 01/24/23  Resp panel by RT-PCR (RSV, Flu A&B, Covid) Anterior Nasal Swab   Specimen: Anterior Nasal Swab  Result Value Ref Range   SARS Coronavirus 2 by RT PCR NEGATIVE NEGATIVE   Influenza A by PCR NEGATIVE NEGATIVE   Influenza B by PCR NEGATIVE NEGATIVE   Resp Syncytial Virus by PCR NEGATIVE NEGATIVE   DG Chest 2 View  Result Date: 01/24/2023 CLINICAL DATA:  Cough and shortness of breath EXAM: CHEST - 2 VIEW COMPARISON:  X-ray 02/28/2021 FINDINGS: Chronic lung changes. No pneumothorax, effusion, consolidation or edema. Stable cardiopericardial silhouette. Degenerative changes along the spine. IMPRESSION: No acute cardiopulmonary disease.  Chronic changes. Electronically Signed   By: Karen Kays M.D.   On: 01/24/2023 19:15     Procedures Procedures    Medications Ordered in ED Medications  ipratropium-albuterol (DUONEB) 0.5-2.5 (3)  MG/3ML nebulizer solution 3 mL (3 mLs Nebulization Given 01/24/23 1930)  albuterol (PROVENTIL) (2.5 MG/3ML) 0.083% nebulizer solution 2.5 mg (2.5 mg Nebulization Given 01/24/23 1930)  cefTRIAXone (ROCEPHIN) 1 g in sodium chloride 0.9 % 100 mL IVPB (0 g Intravenous Stopped 01/24/23 2017)  azithromycin (ZITHROMAX) 500 mg in dextrose 5 % 250 mL IVPB (0 mg Intravenous Stopped 01/24/23 2050)  methylPREDNISolone sodium succinate (SOLU-MEDROL) 125 mg/2 mL injection 125 mg (125 mg Intravenous Given 01/24/23 2119)  albuterol (PROVENTIL) (2.5 MG/3ML) 0.083% nebulizer solution 10 mg (10 mg Nebulization Given 01/24/23 2135)    ED Course/ Medical Decision Making/ A&P  Medical Decision Making Patient presenting with flulike symptoms along with cough which has been productive, shortness of breath with rhonchi and wheezing on exam.  Although his chest x-ray is clear I do believe clinically he has an early pneumonia.  Given a DuoNeb treatment and had persistent wheezing although improved, more pronounced rhonchi on reexam.  He was then given an hour-long neb neb treatment after which his wheezing was substantially improved in his breathing was a lot more comfortable for him.  He was also given 1 dose of Solu-Medrol to help with wheezing, he was also covered for suspected CAP with Rocephin and Zithromax, he was ambulated after his second breathing treatment and he felt comfortable with his breathing, his pulse ox remained greater than 93%, he was sent home to complete azithromycin and given a prescription for an albuterol MDI since his presenting one is expired.  He was given strict return precautions for any worsening shortness of breath, weakness or any new complaints.  He was stable at time of discharge.  Amount and/or Complexity of Data Reviewed Labs: ordered.    Details: Respiratory panel negative Radiology: ordered.    Details: Chest x-ray reviewed, agree with interpretation by  do suspect he has a brewing right lower pneumonia.  Risk Prescription drug management.           Final Clinical Impression(s) / ED Diagnoses Final diagnoses:  Community acquired pneumonia, unspecified laterality    Rx / DC Orders ED Discharge Orders          Ordered    predniSONE (DELTASONE) 50 MG tablet        01/24/23 2243    azithromycin (ZITHROMAX) 250 MG tablet  Daily        01/24/23 2243    albuterol (VENTOLIN HFA) 108 (90 Base) MCG/ACT inhaler  Every 6 hours PRN        01/24/23 2243              Burgess Amor, PA-C 01/27/23 1703    Bethann Berkshire, MD 01/29/23 [id number]

## 2023-01-24 NOTE — ED Notes (Signed)
Nurse called Xray and they voiced radiology is behind.

## 2023-01-24 NOTE — ED Notes (Signed)
Reviewed D/C information with the patient, pt verbalized understanding. No additional concerns at this time.

## 2023-03-29 ENCOUNTER — Encounter: Payer: Self-pay | Admitting: Family Medicine

## 2023-04-12 ENCOUNTER — Other Ambulatory Visit: Payer: Self-pay | Admitting: Cardiology

## 2023-05-21 ENCOUNTER — Emergency Department (HOSPITAL_COMMUNITY)
Admission: EM | Admit: 2023-05-21 | Discharge: 2023-05-21 | Discharge status: Against Medical Advice | Attending: Emergency Medicine | Admitting: Emergency Medicine

## 2023-05-21 ENCOUNTER — Emergency Department (HOSPITAL_COMMUNITY)

## 2023-05-21 ENCOUNTER — Other Ambulatory Visit: Payer: Self-pay

## 2023-05-21 ENCOUNTER — Encounter (HOSPITAL_COMMUNITY): Payer: Self-pay

## 2023-05-21 DIAGNOSIS — Z5329 Procedure and treatment not carried out because of patient's decision for other reasons: Secondary | ICD-10-CM | POA: Diagnosis not present

## 2023-05-21 DIAGNOSIS — Z7951 Long term (current) use of inhaled steroids: Secondary | ICD-10-CM | POA: Insufficient documentation

## 2023-05-21 DIAGNOSIS — N189 Chronic kidney disease, unspecified: Secondary | ICD-10-CM | POA: Diagnosis not present

## 2023-05-21 DIAGNOSIS — J101 Influenza due to other identified influenza virus with other respiratory manifestations: Secondary | ICD-10-CM | POA: Insufficient documentation

## 2023-05-21 DIAGNOSIS — Z79899 Other long term (current) drug therapy: Secondary | ICD-10-CM | POA: Insufficient documentation

## 2023-05-21 DIAGNOSIS — J449 Chronic obstructive pulmonary disease, unspecified: Secondary | ICD-10-CM | POA: Insufficient documentation

## 2023-05-21 DIAGNOSIS — Z7952 Long term (current) use of systemic steroids: Secondary | ICD-10-CM | POA: Diagnosis not present

## 2023-05-21 DIAGNOSIS — Z7982 Long term (current) use of aspirin: Secondary | ICD-10-CM | POA: Insufficient documentation

## 2023-05-21 DIAGNOSIS — R0902 Hypoxemia: Secondary | ICD-10-CM | POA: Insufficient documentation

## 2023-05-21 DIAGNOSIS — I130 Hypertensive heart and chronic kidney disease with heart failure and stage 1 through stage 4 chronic kidney disease, or unspecified chronic kidney disease: Secondary | ICD-10-CM | POA: Insufficient documentation

## 2023-05-21 DIAGNOSIS — I509 Heart failure, unspecified: Secondary | ICD-10-CM | POA: Diagnosis not present

## 2023-05-21 DIAGNOSIS — R059 Cough, unspecified: Secondary | ICD-10-CM | POA: Diagnosis present

## 2023-05-21 LAB — CBC WITH DIFFERENTIAL/PLATELET
Abs Immature Granulocytes: 0.03 10*3/uL (ref 0.00–0.07)
Basophils Absolute: 0 10*3/uL (ref 0.0–0.1)
Basophils Relative: 1 %
Eosinophils Absolute: 0.1 10*3/uL (ref 0.0–0.5)
Eosinophils Relative: 2 %
HCT: 53.5 % — ABNORMAL HIGH (ref 39.0–52.0)
Hemoglobin: 17.6 g/dL — ABNORMAL HIGH (ref 13.0–17.0)
Immature Granulocytes: 1 %
Lymphocytes Relative: 7 %
Lymphs Abs: 0.4 10*3/uL — ABNORMAL LOW (ref 0.7–4.0)
MCH: 28.6 pg (ref 26.0–34.0)
MCHC: 32.9 g/dL (ref 30.0–36.0)
MCV: 86.9 fL (ref 80.0–100.0)
Monocytes Absolute: 0.6 10*3/uL (ref 0.1–1.0)
Monocytes Relative: 11 %
Neutro Abs: 4.3 10*3/uL (ref 1.7–7.7)
Neutrophils Relative %: 78 %
Platelets: 203 10*3/uL (ref 150–400)
RBC: 6.16 MIL/uL — ABNORMAL HIGH (ref 4.22–5.81)
RDW: 14.8 % (ref 11.5–15.5)
WBC: 5.5 10*3/uL (ref 4.0–10.5)
nRBC: 0 % (ref 0.0–0.2)

## 2023-05-21 LAB — RESP PANEL BY RT-PCR (RSV, FLU A&B, COVID)  RVPGX2
Influenza A by PCR: NEGATIVE
Influenza B by PCR: POSITIVE — AB
Resp Syncytial Virus by PCR: NEGATIVE
SARS Coronavirus 2 by RT PCR: NEGATIVE

## 2023-05-21 LAB — BASIC METABOLIC PANEL
Anion gap: 10 (ref 5–15)
BUN: 18 mg/dL (ref 8–23)
CO2: 21 mmol/L — ABNORMAL LOW (ref 22–32)
Calcium: 8.7 mg/dL — ABNORMAL LOW (ref 8.9–10.3)
Chloride: 104 mmol/L (ref 98–111)
Creatinine, Ser: 1.09 mg/dL (ref 0.61–1.24)
GFR, Estimated: >60 mL/min (ref >60)
Glucose, Bld: 95 mg/dL (ref 70–99)
Potassium: 3.6 mmol/L (ref 3.5–5.1)
Sodium: 135 mmol/L (ref 135–145)

## 2023-05-21 MED ORDER — ALBUTEROL SULFATE HFA 108 (90 BASE) MCG/ACT IN AERS
2.0000 | INHALATION_SPRAY | Freq: Four times a day (QID) | RESPIRATORY_TRACT | Status: DC
  Administered 2023-05-21: 2 via RESPIRATORY_TRACT
  Filled 2023-05-21: qty 6.7

## 2023-05-21 MED ORDER — OSELTAMIVIR PHOSPHATE 75 MG PO CAPS
75.0000 mg | ORAL_CAPSULE | Freq: Once | ORAL | Status: AC
  Administered 2023-05-21: 75 mg via ORAL
  Filled 2023-05-21: qty 1

## 2023-05-21 MED ORDER — IPRATROPIUM-ALBUTEROL 0.5-2.5 (3) MG/3ML IN SOLN
RESPIRATORY_TRACT | Status: AC
  Administered 2023-05-21: 3 mL
  Filled 2023-05-21: qty 3

## 2023-05-21 MED ORDER — OSELTAMIVIR PHOSPHATE 75 MG PO CAPS: 75.0000 mg | ORAL_CAPSULE | Freq: Two times a day (BID) | ORAL | 0 refills | Status: DC

## 2023-05-21 MED ORDER — IPRATROPIUM-ALBUTEROL 0.5-2.5 (3) MG/3ML IN SOLN: 3.0000 mL | RESPIRATORY_TRACT | 0 refills | Status: DC | PRN

## 2023-05-21 MED ORDER — AEROCHAMBER PLUS FLO-VU MEDIUM MISC
1.0000 | Freq: Once | Status: AC
  Administered 2023-05-21: 1
  Filled 2023-05-21: qty 1

## 2023-05-21 MED ORDER — IPRATROPIUM-ALBUTEROL 0.5-2.5 (3) MG/3ML IN SOLN
3.0000 mL | Freq: Once | RESPIRATORY_TRACT | Status: AC
  Administered 2023-05-21: 3 mL via RESPIRATORY_TRACT
  Filled 2023-05-21: qty 3

## 2023-05-21 NOTE — Discharge Instructions (Signed)
 You are seen in the emergency department today for concerns of flulike symptoms.  You tested positive for influenza B.  The largest concern with your labs and vitals today was that you had notable decline in her oxygen saturation levels.  I do not recommend that you go home at this time and instead suggest that you should be admitted for management of your oxygen to reduce the risk of any complications from this.  However, you prefer to be discharged home.  I sent in several medications to your pharmacy including Tamiflu and DuoNebs.  Please pick these up and take these as prescribed.  For any concerns or new or worsening symptoms, please return to the emergency department immediately for further evaluation.

## 2023-05-21 NOTE — ED Triage Notes (Signed)
 Pt stated that he has SOB, cough, fevers. Pt stated his son had the Flu

## 2023-05-21 NOTE — ED Notes (Signed)
 Patient transported to X-ray

## 2023-05-21 NOTE — ED Notes (Signed)
 Pt stated his O2 normally runs in the high 80's. His PCP is aware and he is not prescribed at home O2

## 2023-05-21 NOTE — ED Provider Notes (Signed)
 [place] EMERGENCY DEPARTMENT AT [facility] Provider Note   CSN: [id number] Arrival date & time: 05/21/23  1824     History No chief complaint on file.   John Wilkerson is a 63 y.o. male.  Patient was self-reported history of COPD, CHF, hypertension, CKD, GERD here with concerns of flulike symptoms.  Endorsing some nausea and congestion, cough, fevers.  States that his son has had the flu but has not had close contact with him.  No other close contacts as far as he is aware with any similar symptoms.  Denies at this time any chest pain or feelings of dyspnea.   HPI     Home Medications Prior to Admission medications   Medication Sig Start Date End Date Taking? Authorizing Provider  ipratropium-albuterol (DUONEB) 0.5-2.5 (3) MG/3ML SOLN Take 3 mLs by nebulization every 4 (four) hours as needed. 05/21/23  Yes Smitty Knudsen, PA-C  oseltamivir (TAMIFLU) 75 MG capsule Take 1 capsule (75 mg total) by mouth every 12 (twelve) hours. 05/21/23  Yes Smitty Knudsen, PA-C  albuterol (VENTOLIN HFA) 108 (90 Base) MCG/ACT inhaler Inhale 1-2 puffs into the lungs every 6 (six) hours as needed for wheezing or shortness of breath. 01/24/23   Burgess Amor, PA-C  amLODipine (NORVASC) 10 MG tablet TAKE 1 TABLET BY MOUTH EVERY DAY 07/12/22   Antoine Poche, MD  Ascorbic Acid (VITAMIN C) 1000 MG tablet Take 1,000 mg by mouth daily.    [provider]  aspirin (ASPIRIN LOW DOSE) 81 MG chewable tablet Chew 1 tablet (81 mg total) by mouth daily. 12/01/22   Antoine Poche, MD  azithromycin (ZITHROMAX) 250 MG tablet Take 1 tablet (250 mg total) by mouth daily. 1 tablet daily for 4 days. 01/24/23   Burgess Amor, PA-C  carvedilol (COREG) 12.5 MG tablet TAKE 1 TABLET BY MOUTH TWICE A DAY 07/12/22   Antoine Poche, MD  empagliflozin (JARDIANCE) 10 MG TABS tablet TAKE 1 TABLET BY MOUTH EVERY DAY 04/12/23   Antoine Poche, MD  escitalopram (LEXAPRO) 5 MG tablet Take 5 mg by mouth daily.     [provider]  montelukast (SINGULAIR) 10 MG tablet Take 10 mg by mouth daily. 12/01/21   [provider]  Multiple Vitamin (MULTIVITAMIN ADULT PO) Take 1 tablet by mouth daily.    [provider]  nitroGLYCERIN (NITROSTAT) 0.4 MG SL tablet Place 1 tablet (0.4 mg total) under the tongue every 5 (five) minutes x 3 doses as needed for chest pain. 08/21/20   Bhagat, Sharrell Ku, PA  Omega-3 Fatty Acids (FISH OIL OMEGA-3 PO) Take 2 capsules by mouth daily.    [provider]  predniSONE (DELTASONE) 50 MG tablet One tablet daily by mouth for 4 days. 01/24/23   Burgess Amor, PA-C  rosuvastatin (CRESTOR) 40 MG tablet Take 40 mg by mouth daily. 06/09/22   [provider]      Allergies    Codeine and Doxycycline    Review of Systems   Review of Systems  Constitutional:  Positive for fever.  All other systems reviewed and are negative.   Physical Exam Updated Vital Signs BP 122/69   Pulse 84   Temp 98.9 F (37.2 C) (Oral)   Resp 20   Ht 6' (1.829 m)   Wt 104.3 kg   SpO2 (!) 84%   BMI 31.19 kg/m  Physical Exam Vitals and nursing note reviewed.  Constitutional:      General: He is not  in acute distress.    Appearance: He is well-developed.  HENT:     Head: Normocephalic and atraumatic.  Eyes:     Conjunctiva/sclera: Conjunctivae normal.  Cardiovascular:     Rate and Rhythm: Normal rate and regular rhythm.     Heart sounds: No murmur heard. Pulmonary:     Effort: Pulmonary effort is normal. No respiratory distress.     Breath sounds: Normal breath sounds. No wheezing, rhonchi or rales.  Abdominal:     Palpations: Abdomen is soft.     Tenderness: There is no abdominal tenderness.  Musculoskeletal:        General: No swelling.     Cervical back: Neck supple.  Skin:    General: Skin is warm and dry.     Capillary Refill: Capillary refill takes less than 2 seconds.  Neurological:     Mental Status: He is alert.  Psychiatric:         Mood and Affect: Mood normal.     ED Results / Procedures / Treatments   Labs (all labs ordered are listed, but only abnormal results are displayed) Labs Reviewed  RESP PANEL BY RT-PCR (RSV, FLU A&B, COVID)  RVPGX2 - Abnormal; Notable for the following components:      Result Value   Influenza B by PCR POSITIVE (*)    All other components within normal limits  CBC WITH DIFFERENTIAL/PLATELET - Abnormal; Notable for the following components:   RBC 6.16 (*)    Hemoglobin 17.6 (*)    HCT 53.5 (*)    Lymphs Abs 0.4 (*)    All other components within normal limits  BASIC METABOLIC PANEL - Abnormal; Notable for the following components:   CO2 21 (*)    Calcium 8.7 (*)    All other components within normal limits    EKG None  Radiology DG Chest 2 View Result Date: 05/21/2023 CLINICAL DATA:  Cough, shortness of breath. EXAM: CHEST - 2 VIEW COMPARISON:  January 24, 2023. FINDINGS: The heart size and mediastinal contours are within normal limits. Both lungs are clear. The visualized skeletal structures are unremarkable. IMPRESSION: No active cardiopulmonary disease. Electronically Signed   By: Lupita Raider M.D.   On: 05/21/2023 19:58    Procedures Procedures    Medications Ordered in ED Medications  ipratropium-albuterol (DUONEB) 0.5-2.5 (3) MG/3ML nebulizer solution 3 mL (3 mLs Nebulization Given 05/21/23 2009)  ipratropium-albuterol (DUONEB) 0.5-2.5 (3) MG/3ML nebulizer solution (3 mLs  Given 05/21/23 2009)    ED Course/ Medical Decision Making/ A&P                                 Medical Decision Making Amount and/or Complexity of Data Reviewed Labs: ordered. Radiology: ordered.  Risk Prescription drug management.   This patient presents to the ED for concern of flulike symptoms.  Differential diagnosis includes COVID-19, fluids, pneumonia, hypoxia, respiratory failure    Lab Tests:  I Ordered, and personally interpreted labs.  The pertinent results include: CBC  with plasma concentration with hemoglobin up to 17.6, BMP unremarkable, respiratory panel positive for influenza B   Imaging Studies ordered:  I ordered imaging studies including chest x-ray I independently visualized and interpreted imaging which showed no evidence of any acute cardiopulmonary findings I agree with the radiologist interpretation   Medicines ordered and prescription drug management:  I ordered medication including DuoNeb for hypoxia Reevaluation of the patient after these  medicines showed that the patient stayed the same I have reviewed the patients home medicines and have made adjustments as needed   Problem List / ED Course:  Patient presents to the emergency department today with concerns of flulike symptoms.  Endorsing shortness of breath, cough, fevers.  States that his son recently tested positive for influenza.  No other sick contacts.  Patient states that he has a history of COPD but does not use any oxygen at home. Physical exam is otherwise reassuring.  No abnormal lung sounds.  Some mild crackling but clears with coughing.  No significant wheezing. Lab workup reassuring.  Possible hemoconcentration hemoglobin elevated.  BMP unremarkable.  X-ray panel positive for influenza B.  Chest x-ray unremarkable. After breathing treatments, patient's oxygen saturation still remaining in the mid to upper 80s.  Patient is unsure what his oxygen levels are at home.  No oxygen use at home.  Requiring 2 L via nasal cannula for oxygen to remain in the upper 80s to low 90%.  Advise admission for influenza B and symptomatic hypoxia.  Patient refusing admission at this time would prefer to be discharged home.  Will also refer to be sent home with antiviral medication and breathing treatments.  Given patient requiring supplemental oxygen which is abnormal from baseline, I do not feel comfortable discharging patient home at this time.  Advised patient that he will be leaving AGAINST  MEDICAL ADVICE which could entail decline in status including up to but not limited to worsening respiratory state, cardiac disease, disability, and death. Patient accepts these risks and will be signs AMA paperwork. Advised patient to return to the nearest ER if he hasn't any worsening of symptoms. Discharged home.  Final Clinical Impression(s) / ED Diagnoses Final diagnoses:  Influenza B  Hypoxia    Rx / DC Orders ED Discharge Orders          Ordered    oseltamivir (TAMIFLU) 75 MG capsule  Every 12 hours        05/21/23 2108    ipratropium-albuterol (DUONEB) 0.5-2.5 (3) MG/3ML SOLN  Every 4 hours PRN        05/21/23 2108              Salomon Mast 05/21/23 2113    Eber Hong, MD 05/21/23 2251

## 2023-05-21 NOTE — ED Notes (Signed)
 Pt placed back on 2 L. RN left him on RA

## 2023-05-21 NOTE — ED Notes (Signed)
 ED Provider at bedside. Pt desiring to go home despite risks explained. Rn and EDP discussed that pt has low oxygen sats when not on oxygen and needs to be on oxygen. Needs to stay in hospital to be monitored and be on oxygen. Pt voiced understanding but desires to go home despite risk. States he will call his PCP tomorrow.

## 2023-05-21 NOTE — ED Notes (Signed)
 Pt ambulated around unit. Sats consistently 84-85% and HR 100.

## 2023-06-05 ENCOUNTER — Other Ambulatory Visit: Payer: Self-pay | Admitting: Cardiology

## 2023-06-13 ENCOUNTER — Telehealth: Payer: Self-pay | Admitting: Student

## 2023-06-13 MED ORDER — ROSUVASTATIN CALCIUM 40 MG PO TABS: 40.0000 mg | ORAL_TABLET | Freq: Every day | ORAL | 1 refills | Status: DC

## 2023-06-13 MED ORDER — EMPAGLIFLOZIN 10 MG PO TABS: 10.0000 mg | ORAL_TABLET | Freq: Every day | ORAL | 6 refills | Status: DC

## 2023-06-13 NOTE — Telephone Encounter (Signed)
 Has apt scheduled, refilled as requested

## 2023-06-13 NOTE — Telephone Encounter (Signed)
*  STAT* If patient is at the pharmacy, call can be transferred to refill team.   1. Which medications need to be refilled? (please list name of each medication and dose if known)  new prescription  for Rosuvastatin and Jardiance   2. Would you like to learn more about the convenience, safety, & potential cost savings by using the [facility]?     3. Are you open to using the [facility] (Type [facility].   4. Which pharmacy/location (including street and city if local pharmacy) is medication to be sent to?[facility] [address] [place]   5. Do they need a 30 day or 90 day supply? 90 days and refills

## 2023-07-04 ENCOUNTER — Other Ambulatory Visit: Payer: Self-pay | Admitting: Cardiology

## 2023-07-19 ENCOUNTER — Telehealth: Payer: Self-pay | Admitting: Cardiology

## 2023-07-19 NOTE — Telephone Encounter (Signed)
 Pt came in office today and stated that his insurance told him they will only cover his Jardiance  under a certain brand. He is not sure the brand at the moment. He is saying they said they will reach out to Dr. Amanda Jungling but the Pt has not heard anything and cannot get any refills because of this. [phone] is the best number to reach the pt at this time. [facility] uses [facility] [address], [place].

## 2023-07-19 NOTE — Telephone Encounter (Signed)
 Returned call to pt who states that he is unable to refill [facility]ardiance  at [facility] d/t insurance. [facility]oke [facility] in [place] on [address] That states that according to insurance. The pt may only refill Jardiance  two times. Any other refill must come from his mail order pharmacy. Pt is unsure what mail order pharmacy he is to use so he will call the insurance company to verify.

## 2023-07-22 ENCOUNTER — Other Ambulatory Visit: Payer: Self-pay | Admitting: Cardiology

## 2023-08-18 NOTE — Progress Notes (Unsigned)
 Cardiology Office Note    Date:  08/19/2023  ID:  John Wilkerson, DOB 08-03-1960, MRN [id number] Cardiologist: Armida Lander, MD    History of Present Illness:    John Wilkerson is a 63 y.o. 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  He was last examined by myself in 08/2022 and denied any recent anginal symptoms or palpitations. Did report his father passed away from aortic aneurysm and wished to undergo screening for this. He reported occasional leg discomfort with ambulation and then would improve with rest. Options for lower extremity ABI's and Doppler studies were reviewed but he wished to hold off at that time. Aortic ultrasound showed his abdominal aorta measured 3.1 cm with follow-up recommended every 3 years.  In talking with the patient today, he reports his biggest limiting factor has been fatigue. Says this has occurred since his heart attack in 2022 and he feels like this is possibly due to all of the medications that were added at that time. Was previously going to the gym a few days a week and doing weight training but has not been within the past month due to to his wife getting hurt while at the gym. They are planning to resume going to the gym in the next few weeks. He denies any chest pain or dyspnea on exertion. No specific orthopnea, PND or pitting edema. Does report having intermittent pain along his legs which he describes as a burning pain and this can occur at rest or with activity. Previously reviewed options for lower extremity ABI's and doppler studies and he wished to hold off at that time and continues to want to hold off on further evaluation unless symptoms progress.  Studies Reviewed:   EKG: EKG is ordered today and demonstrates:    EKG Interpretation Date/Time:  Friday August 19 2023 14:08:24 EDT Ventricular Rate:  68 PR Interval:  162 QRS Duration:  112 QT Interval:  422 QTC Calculation: 448 R Axis:   270  Text Interpretation: Normal sinus rhythm Left axis deviation Old inferior infarct pattern. Confirmed by Woodfin Hays ([id number]) on 08/19/2023 2:14:56 PM       Cardiac Catheterization: 08/2020 Mid LAD-1 lesion is 75% stenosed. A drug-eluting stent was successfully placed using a STENT RESOLUTE ONYX 3.5X22. Post intervention, there is a 0% residual stenosis.   1. Severe mid LAD stenosis 2. Successful PTCA/DES x 1 mid LAD   Recommendations: Continue DAPT with ASA/Brilinta  for 12 months.   Echocardiogram: 12/2020 IMPRESSIONS     1. Left ventricular ejection fraction, by estimation, is 55%. The left  ventricle has normal function. The left ventricle has no regional wall  motion abnormalities. The left ventricular internal cavity size was mildly  dilated. There is moderate  concentric left ventricular hypertrophy. Left ventricular diastolic  parameters are consistent with Grade I diastolic dysfunction (impaired  relaxation). Normal global longitudinal strain of -16.4%.   2. Right ventricular systolic function is normal. The right ventricular  size is normal. There is normal pulmonary artery systolic pressure. The  estimated right ventricular systolic pressure is 17.4 mmHg.   3. The mitral valve is grossly normal. Mild mitral valve regurgitation.   4. The aortic valve is tricuspid. There is mild calcification  of the  aortic valve. Aortic valve regurgitation is not visualized.   5. The inferior vena cava is normal in size with greater than 50%  respiratory variability, suggesting right atrial pressure of 3 mmHg.   Comparison(s): Prior images reviewed side by side. LVEF now approximately  55% with mild diastolic dysfunction.    Physical Exam:   VS:  BP 118/70 (BP Location: Right Arm, Cuff Size:  Normal)   Pulse 73   Ht 5\' 10"  (1.778 m)   Wt 231 lb 9.6 oz (105.1 kg)   SpO2 91%   BMI 33.23 kg/m    Wt Readings from Last 3 Encounters:  08/19/23 231 lb 9.6 oz (105.1 kg)  05/21/23 230 lb (104.3 kg)  01/24/23 240 lb (108.9 kg)     GEN: Well nourished, well developed male appearing in no acute distress NECK: No JVD; No carotid bruits CARDIAC: RRR, no murmurs, rubs, gallops RESPIRATORY:  Clear to auscultation without rales, wheezing or rhonchi  ABDOMEN: Appears non-distended. No obvious abdominal masses. EXTREMITIES: No clubbing or cyanosis. No pitting edema.  Distal pedal pulses are 2+ bilaterally.   Assessment and Plan:   1. Coronary artery disease involving native coronary artery of native heart without angina pectoris - He did have an inferior STEMI in 08/2020 with DES placement to the mid-distal RCA and staged PCI of DES to the LAD. - He denies any recent chest pain or dyspnea on exertion but does report fatigue since 2022.  He feels like this is possibly due to his medications. In reviewing his medication list, Coreg  is the most common culprit. Will reduce from 12.5 mg twice daily to 6.25 mg twice daily to see if this helps with symptoms. Continue ASA 81 mg daily and Crestor  40 mg daily.  2. Essential hypertension - BP is well-controlled at 118/70 during today's visit. As discussed above, will reduce Coreg  from 12.5 mg twice daily to 6.25 mg twice daily to see if this helps with his fatigue. He was provided with a BP log and encouraged to return this in 3 to 4 weeks. Continue Amlodipine  10 mg daily. If BP becomes elevated but he feels better off of Coreg , could add an ARB for BP control given improvement in renal function over the past few years.  3. Hyperlipidemia LDL goal <70 - LDL was at 78 in 03/2023. He has been consuming fried foods at time and wishes to focus on dietary changes for now. If LDL remains above goal by repeat check, would add Zetia 10 mg daily. Continue  Crestor  40 mg daily for now.  4. CKD stage 3a, GFR 45-59 ml/min (HCC) - Creatinine was stable at 1.09 when checked in 05/2023 which has improved from his prior baseline.  5. Screening for AAA (abdominal aortic aneurysm) - Ultrasound of his aorta in 08/2022 showed his abdominal aorta measured 3.1 cm with follow-up recommended every 3 years. Will plan for repeat imaging in 2027.   Signed, Dorma Gash, PA-C

## 2023-08-19 ENCOUNTER — Encounter: Payer: Self-pay | Admitting: Student

## 2023-08-19 ENCOUNTER — Ambulatory Visit: Attending: Student | Admitting: Student

## 2023-08-19 VITALS — BP 118/70 | HR 73 | Ht 70.0 in | Wt 231.6 lb

## 2023-08-19 DIAGNOSIS — I2510 Atherosclerotic heart disease of native coronary artery without angina pectoris: Secondary | ICD-10-CM

## 2023-08-19 DIAGNOSIS — I10 Essential (primary) hypertension: Secondary | ICD-10-CM | POA: Diagnosis not present

## 2023-08-19 DIAGNOSIS — Z136 Encounter for screening for cardiovascular disorders: Secondary | ICD-10-CM | POA: Diagnosis present

## 2023-08-19 DIAGNOSIS — E785 Hyperlipidemia, unspecified: Secondary | ICD-10-CM

## 2023-08-19 DIAGNOSIS — N1831 Chronic kidney disease, stage 3a: Secondary | ICD-10-CM

## 2023-08-19 MED ORDER — ASPIRIN 81 MG PO TBEC: 81.0000 mg | DELAYED_RELEASE_TABLET | Freq: Every day | ORAL | 3 refills | Status: AC

## 2023-08-19 MED ORDER — CARVEDILOL 6.25 MG PO TABS: 6.2500 mg | ORAL_TABLET | Freq: Two times a day (BID) | ORAL | Status: AC

## 2023-08-19 NOTE — Patient Instructions (Signed)
 Medication Instructions:   Decrease Coreg  to 6.25 mg Two Times Daily   Your provider request that you monitor your blood pressure for 1 month and record readings.   *If you need a refill on your cardiac medications before your next appointment, please call your pharmacy*  Lab Work: NONE   If you have labs (blood work) drawn today and your tests are completely normal, you will receive your results only by: MyChart Message (if you have MyChart) OR A paper copy in the mail If you have any lab test that is abnormal or we need to change your treatment, we will call you to review the results.  Testing/Procedures: NONE   Follow-Up: At [facility], you and your health needs are our priority.  As part of our continuing mission to provide you with exceptional heart care, our providers are all part of one team.  This team includes your primary Cardiologist (physician) and Advanced Practice Providers or APPs (Physician Assistants and Nurse Practitioners) who all work together to provide you with the care you need, when you need it.  Your next appointment:   6 month(s)  Provider:   You may see Armida Lander, MD or one of the following Advanced Practice Providers on your designated Care Team:   Woodfin Hays, PA-C  [place], [place] Theotis Flake, [place]     We recommend signing up for the patient portal called "MyChart".  Sign up information is provided on this After Visit Summary.  MyChart is used to connect with patients for Virtual Visits (Telemedicine).  Patients are able to view lab/test results, encounter notes, upcoming appointments, etc.  Non-urgent messages can be sent to your provider as well.   To learn more about what you can do with MyChart, go to [email or web address].   Other Instructions Thank you for choosing [facility]!

## 2023-12-01 ENCOUNTER — Other Ambulatory Visit: Payer: Self-pay | Admitting: Cardiology

## 2024-01-17 ENCOUNTER — Other Ambulatory Visit: Payer: Self-pay | Admitting: Cardiology

## 2024-01-19 MED ORDER — EMPAGLIFLOZIN 10 MG PO TABS: 10.0000 mg | ORAL_TABLET | Freq: Every day | ORAL | 2 refills | Status: AC

## 2024-03-17 ENCOUNTER — Other Ambulatory Visit: Payer: Self-pay | Admitting: Cardiology
